# Patient Record
Sex: Female | Born: 1950
Health system: Southern US, Community
[De-identification: ages and names within clinical notes are randomized; demographics above are authoritative.]

## PROBLEM LIST (undated history)

## (undated) DIAGNOSIS — J449 Chronic obstructive pulmonary disease, unspecified: Secondary | ICD-10-CM

## (undated) DIAGNOSIS — I10 Essential (primary) hypertension: Secondary | ICD-10-CM

## (undated) DIAGNOSIS — J45909 Unspecified asthma, uncomplicated: Secondary | ICD-10-CM

## (undated) DIAGNOSIS — K219 Gastro-esophageal reflux disease without esophagitis: Secondary | ICD-10-CM

## (undated) DIAGNOSIS — E119 Type 2 diabetes mellitus without complications: Secondary | ICD-10-CM

## (undated) DIAGNOSIS — J40 Bronchitis, not specified as acute or chronic: Secondary | ICD-10-CM

## (undated) DIAGNOSIS — Z87442 Personal history of urinary calculi: Secondary | ICD-10-CM

## (undated) DIAGNOSIS — E7800 Pure hypercholesterolemia, unspecified: Secondary | ICD-10-CM

## (undated) HISTORY — PX: ABDOMINAL HYSTERECTOMY: SHX81

## (undated) HISTORY — DX: Chronic obstructive pulmonary disease, unspecified: J44.9

## (undated) HISTORY — PX: TUBAL LIGATION: SHX77

## (undated) HISTORY — DX: Type 2 diabetes mellitus without complications: E11.9

---

## 2007-07-25 ENCOUNTER — Other Ambulatory Visit: Payer: Self-pay

## 2007-07-25 ENCOUNTER — Emergency Department: Payer: Self-pay | Admitting: Emergency Medicine

## 2007-07-29 ENCOUNTER — Ambulatory Visit: Payer: Self-pay | Admitting: Emergency Medicine

## 2007-11-14 ENCOUNTER — Ambulatory Visit: Payer: Self-pay | Admitting: Cardiology

## 2009-08-05 ENCOUNTER — Emergency Department (HOSPITAL_COMMUNITY): Admission: EM | Admit: 2009-08-05 | Discharge: 2009-08-05 | Payer: Self-pay | Admitting: Emergency Medicine

## 2009-08-21 ENCOUNTER — Emergency Department (HOSPITAL_COMMUNITY): Admission: EM | Admit: 2009-08-21 | Discharge: 2009-08-21 | Payer: Self-pay | Admitting: Emergency Medicine

## 2009-09-02 ENCOUNTER — Emergency Department (HOSPITAL_COMMUNITY): Admission: EM | Admit: 2009-09-02 | Discharge: 2009-09-02 | Payer: Self-pay | Admitting: Emergency Medicine

## 2009-10-17 ENCOUNTER — Emergency Department (HOSPITAL_COMMUNITY): Admission: EM | Admit: 2009-10-17 | Discharge: 2009-10-17 | Payer: Self-pay | Admitting: Emergency Medicine

## 2009-10-24 ENCOUNTER — Emergency Department (HOSPITAL_COMMUNITY): Admission: EM | Admit: 2009-10-24 | Discharge: 2009-10-24 | Payer: Self-pay | Admitting: Emergency Medicine

## 2009-11-01 ENCOUNTER — Emergency Department (HOSPITAL_COMMUNITY): Admission: EM | Admit: 2009-11-01 | Discharge: 2009-11-01 | Payer: Self-pay | Admitting: Emergency Medicine

## 2009-11-13 ENCOUNTER — Emergency Department (HOSPITAL_COMMUNITY): Admission: EM | Admit: 2009-11-13 | Discharge: 2009-11-14 | Payer: Self-pay | Admitting: Emergency Medicine

## 2009-11-26 ENCOUNTER — Emergency Department (HOSPITAL_COMMUNITY): Admission: EM | Admit: 2009-11-26 | Discharge: 2009-11-26 | Payer: Self-pay | Admitting: Emergency Medicine

## 2010-01-20 ENCOUNTER — Emergency Department (HOSPITAL_COMMUNITY): Admission: EM | Admit: 2010-01-20 | Discharge: 2010-01-20 | Payer: Self-pay | Admitting: Emergency Medicine

## 2010-01-25 ENCOUNTER — Emergency Department (HOSPITAL_COMMUNITY): Admission: EM | Admit: 2010-01-25 | Discharge: 2010-01-25 | Payer: Self-pay | Admitting: Emergency Medicine

## 2010-01-30 ENCOUNTER — Ambulatory Visit: Payer: Self-pay | Admitting: Otolaryngology

## 2010-10-07 LAB — BASIC METABOLIC PANEL
BUN: 16 mg/dL (ref 6–23)
BUN: 24 mg/dL — ABNORMAL HIGH (ref 6–23)
CO2: 27 mEq/L (ref 19–32)
CO2: 28 mEq/L (ref 19–32)
Calcium: 8.9 mg/dL (ref 8.4–10.5)
Calcium: 9.5 mg/dL (ref 8.4–10.5)
Chloride: 105 mEq/L (ref 96–112)
Creatinine, Ser: 0.76 mg/dL (ref 0.4–1.2)
Creatinine, Ser: 0.89 mg/dL (ref 0.4–1.2)
GFR calc Af Amer: >60 mL/min (ref >60)
GFR calc non Af Amer: >60 mL/min (ref >60)
Glucose, Bld: 108 mg/dL — ABNORMAL HIGH (ref 70–99)
Glucose, Bld: 116 mg/dL — ABNORMAL HIGH (ref 70–99)
Potassium: 2.8 mEq/L — ABNORMAL LOW (ref 3.5–5.1)
Sodium: 139 mEq/L (ref 135–145)
Sodium: 139 mEq/L (ref 135–145)

## 2010-10-07 LAB — CBC
HCT: 37.8 % (ref 36.0–46.0)
Hemoglobin: 13.4 g/dL (ref 12.0–15.0)
MCHC: 35.6 g/dL (ref 30.0–36.0)
MCV: 84.8 fL (ref 78.0–100.0)
Platelets: 229 10*3/uL (ref 150–400)
RBC: 4.45 MIL/uL (ref 3.87–5.11)
RDW: 14.3 % (ref 11.5–15.5)
WBC: 14.7 10*3/uL — ABNORMAL HIGH (ref 4.0–10.5)

## 2010-10-07 LAB — DIFFERENTIAL
Basophils Absolute: 0.1 10*3/uL (ref 0.0–0.1)
Basophils Relative: 1 % (ref 0–1)
Eosinophils Absolute: 0.1 10*3/uL (ref 0.0–0.7)
Eosinophils Relative: 1 % (ref 0–5)
Lymphocytes Relative: 24 % (ref 12–46)
Lymphs Abs: 3.5 10*3/uL (ref 0.7–4.0)
Monocytes Absolute: 0.9 10*3/uL (ref 0.1–1.0)
Monocytes Relative: 6 % (ref 3–12)
Neutro Abs: 10.2 10*3/uL — ABNORMAL HIGH (ref 1.7–7.7)
Neutrophils Relative %: 69 % (ref 43–77)

## 2010-10-07 LAB — BRAIN NATRIURETIC PEPTIDE: Pro B Natriuretic peptide (BNP): <30 pg/mL (ref 0.0–100.0)

## 2011-10-14 IMAGING — CR DG CHEST 1V PORT
1 series · 1 of 1 positions shown · non-contrast
Comparison: August 21, 2009

CLINICAL DATA: Asthma, shortness of breath

PORTABLE CHEST - 1 VIEW

[view not recorded]
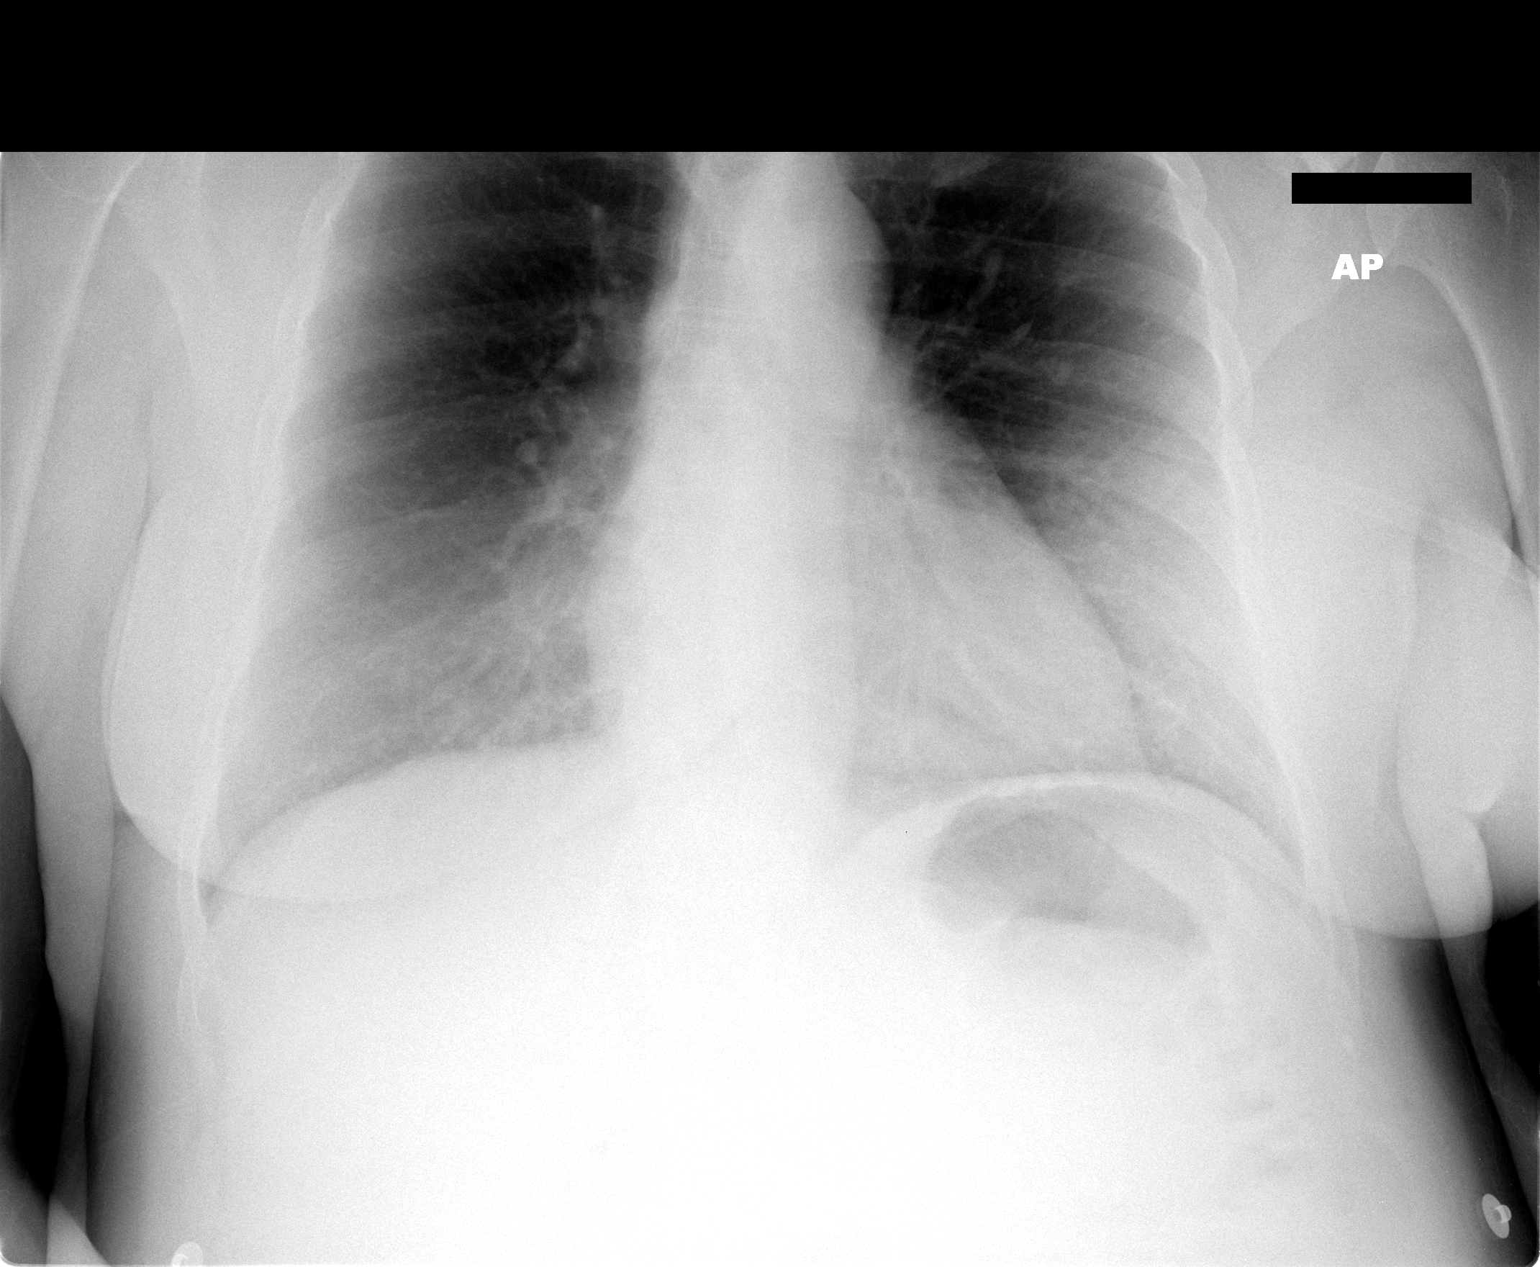

[1 of 1 positions shown; findings below may reference images not displayed]

FINDINGS: The cardiac silhouette, mediastinum, pulmonary
vasculature are within normal limits.  Both lungs are clear.
IMPRESSION: Negative frontal chest x-ray.

## 2012-06-20 ENCOUNTER — Telehealth: Payer: Self-pay

## 2012-06-20 NOTE — Telephone Encounter (Signed)
LMOM to call.

## 2012-07-06 NOTE — Telephone Encounter (Signed)
VM not set up. Will mail another letter. First letter to [address] in [place] was returned.

## 2012-07-22 ENCOUNTER — Telehealth: Payer: Self-pay

## 2012-07-22 NOTE — Telephone Encounter (Signed)
Pt had been referred by Dr. Felecia Shelling for screening colonoscopy. I have received two returned letters back, two separate addresses. One was [address] , and also [address] [place]. I am faxing a note to Dr. Felecia Shelling to see if I can get a correct address or different phone number. I attempted to call again this AM and VM has not been set up.

## 2012-08-15 ENCOUNTER — Other Ambulatory Visit (HOSPITAL_COMMUNITY): Payer: Self-pay | Admitting: Internal Medicine

## 2012-08-15 DIAGNOSIS — M79606 Pain in leg, unspecified: Secondary | ICD-10-CM

## 2012-08-18 ENCOUNTER — Ambulatory Visit (HOSPITAL_COMMUNITY): Payer: Medicaid Other

## 2012-08-19 ENCOUNTER — Ambulatory Visit (HOSPITAL_COMMUNITY): Payer: Medicaid Other | Attending: Internal Medicine

## 2012-09-01 ENCOUNTER — Ambulatory Visit (HOSPITAL_COMMUNITY): Payer: Medicaid Other | Attending: Internal Medicine

## 2014-01-02 ENCOUNTER — Ambulatory Visit: Payer: Medicaid Other | Admitting: Cardiology

## 2016-04-14 ENCOUNTER — Other Ambulatory Visit (HOSPITAL_COMMUNITY): Payer: Self-pay | Admitting: Internal Medicine

## 2016-04-14 DIAGNOSIS — E119 Type 2 diabetes mellitus without complications: Secondary | ICD-10-CM | POA: Diagnosis not present

## 2016-04-14 DIAGNOSIS — I739 Peripheral vascular disease, unspecified: Secondary | ICD-10-CM | POA: Diagnosis not present

## 2016-04-14 DIAGNOSIS — J449 Chronic obstructive pulmonary disease, unspecified: Secondary | ICD-10-CM | POA: Diagnosis not present

## 2016-04-14 DIAGNOSIS — Z23 Encounter for immunization: Secondary | ICD-10-CM | POA: Diagnosis not present

## 2016-04-14 DIAGNOSIS — F17200 Nicotine dependence, unspecified, uncomplicated: Secondary | ICD-10-CM | POA: Diagnosis not present

## 2016-04-14 DIAGNOSIS — I10 Essential (primary) hypertension: Secondary | ICD-10-CM | POA: Diagnosis not present

## 2016-04-14 DIAGNOSIS — F17210 Nicotine dependence, cigarettes, uncomplicated: Secondary | ICD-10-CM | POA: Diagnosis not present

## 2016-04-17 ENCOUNTER — Ambulatory Visit (HOSPITAL_COMMUNITY): Payer: Medicaid Other

## 2016-04-22 ENCOUNTER — Ambulatory Visit (HOSPITAL_COMMUNITY)
Admission: RE | Admit: 2016-04-22 | Discharge: 2016-04-22 | Disposition: A | Discharge status: Home / Self Care | Payer: Medicare Other | Source: Ambulatory Visit | Attending: Internal Medicine | Admitting: Internal Medicine

## 2016-04-22 DIAGNOSIS — I739 Peripheral vascular disease, unspecified: Secondary | ICD-10-CM | POA: Insufficient documentation

## 2016-04-22 DIAGNOSIS — I70213 Atherosclerosis of native arteries of extremities with intermittent claudication, bilateral legs: Secondary | ICD-10-CM | POA: Diagnosis not present

## 2016-05-01 ENCOUNTER — Other Ambulatory Visit: Payer: Self-pay | Admitting: Vascular Surgery

## 2016-05-01 DIAGNOSIS — I739 Peripheral vascular disease, unspecified: Secondary | ICD-10-CM

## 2016-05-29 ENCOUNTER — Encounter: Payer: Self-pay | Admitting: Vascular Surgery

## 2016-06-04 ENCOUNTER — Ambulatory Visit (INDEPENDENT_AMBULATORY_CARE_PROVIDER_SITE_OTHER): Payer: Medicare Other | Admitting: Vascular Surgery

## 2016-06-04 ENCOUNTER — Ambulatory Visit (HOSPITAL_COMMUNITY)
Admission: RE | Admit: 2016-06-04 | Discharge: 2016-06-04 | Disposition: A | Discharge status: Home / Self Care | Payer: Medicare Other | Source: Ambulatory Visit | Attending: Vascular Surgery | Admitting: Vascular Surgery

## 2016-06-04 ENCOUNTER — Encounter: Payer: Self-pay | Admitting: Vascular Surgery

## 2016-06-04 VITALS — BP 136/78 | HR 85 | Temp 98.2°F | Resp 18 | Ht 68.0 in | Wt 160.8 lb

## 2016-06-04 DIAGNOSIS — F17210 Nicotine dependence, cigarettes, uncomplicated: Secondary | ICD-10-CM | POA: Diagnosis not present

## 2016-06-04 DIAGNOSIS — R0989 Other specified symptoms and signs involving the circulatory and respiratory systems: Secondary | ICD-10-CM | POA: Diagnosis not present

## 2016-06-04 DIAGNOSIS — I739 Peripheral vascular disease, unspecified: Secondary | ICD-10-CM

## 2016-06-04 NOTE — Progress Notes (Addendum)
Referring Physician: Dr Felecia ShellingFanta  Patient name: Andrea Horton MRN: [id number] DOB: May 13, 1951 Sex: female  REASON FOR CONSULT: Right leg claudication  HPI: Andrea Horton is a 65 y.o. female,  with a six-month history of pain in her right calf after walking about 2 blocks. The pain dissipates with rest. She denies rest pain in the right foot. She has no history of nonhealing wounds in the right foot. She currently smokes about 1 pack of cigarettes per day. She states she is interested in quitting. She also has a history of diabetes. She is on aspirin and statin. Other medical problems include COPD and she is on several inhalers and nebulizer for this.  Past Medical History:  Diagnosis Date  . COPD (chronic obstructive pulmonary disease) (HCC)   . Diabetes mellitus without complication (HCC)    History reviewed. No pertinent surgical history.  Family History  Problem Relation Age of Onset  . Heart disease Mother     before age 65  . Heart disease Sister     before age 65  . Heart disease Brother     before age 65    SOCIAL HISTORY: Social History   Social History  . Marital status: Single    Spouse name: N/A  . Number of children: N/A  . Years of education: N/A   Occupational History  . Not on file.   Social History Main Topics  . Smoking status: Current Every Day Smoker    Packs/day: 1.00    Years: 35.00    Types: Cigarettes  . Smokeless tobacco: Never Used  . Alcohol use No  . Drug use: No  . Sexual activity: Not on file   Other Topics Concern  . Not on file   Social History Narrative  . No narrative on file    No Known Allergies  Current Outpatient Prescriptions  Medication Sig Dispense Refill  . albuterol (PROVENTIL HFA;VENTOLIN HFA) 108 (90 Base) MCG/ACT inhaler Inhale into the lungs every 6 (six) hours as needed for wheezing or shortness of breath.    Marland Kitchen. amLODipine (NORVASC) 10 MG tablet Take 10 mg by mouth daily.    Marland Kitchen. aspirin 81 MG tablet Take 81 mg  by mouth daily.    . Fluticasone-Salmeterol (ADVAIR DISKUS) 100-50 MCG/DOSE AEPB Inhale 1 puff into the lungs 2 (two) times daily.    . metFORMIN (GLUCOPHAGE) 500 MG tablet Take 500 mg by mouth 2 (two) times daily.  3  . simvastatin (ZOCOR) 20 MG tablet Take 20 mg by mouth daily.  3   No current facility-administered medications for this visit.     ROS:   General:  No weight loss, Fever, chills  HEENT: No recent headaches, no nasal bleeding, no visual changes, no sore throat  Neurologic: No dizziness, blackouts, seizures. No recent symptoms of stroke or mini- stroke. No recent episodes of slurred speech, or temporary blindness.  Cardiac: No recent episodes of chest pain/pressure, no shortness of breath at rest.  + shortness of breath with exertion.  Denies history of atrial fibrillation or irregular heartbeat  Vascular: No history of rest pain in feet.  + history of claudication.  No history of non-healing ulcer, No history of DVT   Pulmonary: No home oxygen, + productive cough, no hemoptysis,  No asthma or wheezing  Musculoskeletal:  [ ]  Arthritis, [ ]  Low back pain,  [ ]  Joint pain  Hematologic:No history of hypercoagulable state.  No history of easy bleeding.  No  history of anemia  Gastrointestinal: No hematochezia or melena,  No gastroesophageal reflux, no trouble swallowing  Urinary: [ ]  chronic Kidney disease, [ ]  on HD - [ ]  MWF or [ ]  TTHS, [ ]  Burning with urination, [ ]  Frequent urination, [ ]  Difficulty urinating;   Skin: No rashes  Psychological: No history of anxiety,  No history of depression   Physical Examination  Vitals:   06/04/16 0857  BP: 136/78  Pulse: 85  Resp: 18  Temp: 98.2 F (36.8 C)  TempSrc: Oral  SpO2: 98%  Weight: 160 lb 12.8 oz (72.9 kg)  Height: 5\' 8"  (1.727 m)    Body mass index is 24.45 kg/m.  General:  Alert and oriented, no acute distress HEENT: Normal Neck: bilateral carotid bruits left > right  Pulmonary: Clear to  auscultation bilaterally Cardiac: Regular Rate and Rhythm without murmur Abdomen: Soft, non-tender, non-distended, no mass, no scars Skin: No rash Extremity Pulses:  2+ radial, brachial, femoral, dorsalis pedis, posterior tibial pulses bilaterally Musculoskeletal: No deformity or edema  Neurologic: Upper and lower extremity motor 5/5 and symmetric  DATA:  Patient had bilateral ABIs performed previously which were 0.6 on the right 1.03 on the left. His were done at [facility] 04/22/2016. She had an arterial duplex exam at our office today which showed greater than 75% stenosis of the mid and distal right superficial femoral artery  ASSESSMENT:  Patient with right leg claudication. She is currently not at risk of limb loss with an ABI of 0.6. I discussed with the patient today smoking cessation for greater than 3 minutes. I also discussed with her the possibility of a walking program with conservative management initially of 30 minutes daily. Most patients are able to improve her walking distance somewhat with this. I also discussed with her the possibility of an arteriogram and possible intervention for her right superficial femoral artery stenosis. She currently prefers conservative management with a walking program. She will follow-up with us in 6 months time for repeat ABIs. If she decides she wishes intervention she will follow-up sooner.  She was noted to have bilateral carotid bruits today on exam.  Will get duplex in the near future to evaluate for carotid stenosis    PLAN:  See above   Fabienne Brunsharles Fields, MD [facility] [place] Office: [phone] Pager: [phone]

## 2016-06-04 NOTE — Addendum Note (Signed)
Addended by: Sharee PimpleMCCHESNEY, MARILYN K on: 06/04/2016 03:22 PM   Modules accepted: Orders

## 2016-06-09 ENCOUNTER — Ambulatory Visit: Payer: Medicare Other | Admitting: Family

## 2016-06-09 ENCOUNTER — Encounter (HOSPITAL_COMMUNITY): Payer: Self-pay

## 2016-06-09 ENCOUNTER — Encounter (HOSPITAL_COMMUNITY): Payer: Medicare Other

## 2016-06-17 ENCOUNTER — Encounter: Payer: Self-pay | Admitting: Family

## 2016-06-23 ENCOUNTER — Encounter: Payer: Self-pay | Admitting: Family

## 2016-06-23 ENCOUNTER — Ambulatory Visit (INDEPENDENT_AMBULATORY_CARE_PROVIDER_SITE_OTHER): Payer: Medicare Other | Admitting: Family

## 2016-06-23 ENCOUNTER — Ambulatory Visit (HOSPITAL_COMMUNITY)
Admission: RE | Admit: 2016-06-23 | Discharge: 2016-06-23 | Disposition: A | Discharge status: Home / Self Care | Payer: Medicare Other | Source: Ambulatory Visit | Attending: Vascular Surgery | Admitting: Vascular Surgery

## 2016-06-23 VITALS — BP 130/75 | HR 69 | Temp 97.4°F | Ht 68.0 in | Wt 157.8 lb

## 2016-06-23 DIAGNOSIS — R0989 Other specified symptoms and signs involving the circulatory and respiratory systems: Secondary | ICD-10-CM

## 2016-06-23 DIAGNOSIS — I6522 Occlusion and stenosis of left carotid artery: Secondary | ICD-10-CM

## 2016-06-23 DIAGNOSIS — I70211 Atherosclerosis of native arteries of extremities with intermittent claudication, right leg: Secondary | ICD-10-CM

## 2016-06-23 LAB — VAS US CAROTID
LCCADDIAS: -15 cm/s
LEFT ECA DIAS: -130 cm/s
Left CCA dist sys: -42 cm/s
Left CCA prox dias: 13 cm/s
Left CCA prox sys: 67 cm/s
Left ICA dist dias: -26 cm/s
Left ICA dist sys: -46 cm/s
Left ICA prox dias: 250 cm/s
Left ICA prox sys: 604 cm/s
RCCADSYS: -77 cm/s
RCCAPSYS: 152 cm/s
RIGHT CCA MID DIAS: 13 cm/s
RIGHT ECA DIAS: -30 cm/s
Right CCA prox dias: 18 cm/s

## 2016-06-23 NOTE — Progress Notes (Signed)
[facility] [place] HISTORY AND PHYSICAL   MRN : [id number]  History of Present Illness:   Andrea Horton is a 65 y.o. female patient of Dr. Darrick Penna with a history of pain in her right calf after walking about 2 blocks since about May of 2017. The pain dissipates with rest. She denies rest pain in the right foot. She has no history of nonhealing wounds in the right foot. She currently smokes about 1 pack of cigarettes per day. She states she is interested in quitting. She also has a history of diabetes. She is on aspirin and statin. Other medical problems include COPD and she is on several inhalers and nebulizer for this.  She was evaluated by Dr. Darrick Penna on 06-04-16 and found to have bilateral carotid bruits; she returns today for carotid duplex.  The patient denies any history of TIA or stroke symptoms, specifically the patient denies a history of amaurosis fugax or monocular blindness, denies a history unilateral  of facial drooping, denies a history of hemiplegia, and denies a history of receptive or expressive aphasia.    She reports intermittent right hand numbness since about May of June of 2017 which resolves by shaking her hand, denies weakness  Patient had bilateral ABIs performed previously which were 0.6 on the right 1.03 on the left. This was done at [facility] 04/22/2016. She had an arterial duplex exam at our office on 06-04-16 which showed greater than 75% stenosis of the mid and distal right superficial femoral artery  At her 06-04-16 visit Dr. Darrick Penna indicated that pt is currently not at risk of limb loss with an ABI of 0.6. He discussed with the patient smoking cessation. He also discussed with her the possibility of a walking program with conservative management initially of 30 minutes daily. Most patients are able to improve their walking distance somewhat with this. He also discussed with her the possibility of an arteriogram and possible  intervention for her right superficial femoral artery stenosis. At that time she prefered conservative management with a walking program. She was to follow-up with [place] in 6 months time for repeat ABIs. If she decides she wishes intervention she will follow-up sooner.  Pt Diabetic: Yes, states in control Pt smoker: smoker  (1 ppd x 35 yrs)  Pt meds include: Statin :Yes Betablocker: No ASA: Yes Other anticoagulants/antiplatelets: no   Current Outpatient Prescriptions  Medication Sig Dispense Refill  . albuterol (PROVENTIL HFA;VENTOLIN HFA) 108 (90 Base) MCG/ACT inhaler Inhale into the lungs every 6 (six) hours as needed for wheezing or shortness of breath.    Marland Kitchen amLODipine (NORVASC) 10 MG tablet Take 10 mg by mouth daily.    Marland Kitchen aspirin 81 MG tablet Take 81 mg by mouth daily.    . Fluticasone-Salmeterol (ADVAIR DISKUS) 100-50 MCG/DOSE AEPB Inhale 1 puff into the lungs 2 (two) times daily.    . metFORMIN (GLUCOPHAGE) 500 MG tablet Take 500 mg by mouth 2 (two) times daily.  3  . simvastatin (ZOCOR) 20 MG tablet Take 20 mg by mouth daily.  3   No current facility-administered medications for this visit.     Past Medical History:  Diagnosis Date  . COPD (chronic obstructive pulmonary disease) (HCC)   . Diabetes mellitus without complication [facility])     Social History Social History  Substance Use Topics  . Smoking status: Current Every Day Smoker    Packs/day: 1.00    Years: 35.00    Types: Cigarettes  .  Smokeless tobacco: Never Used  . Alcohol use No    Family History Family History  Problem Relation Age of Onset  . Heart disease Mother     before age 65  . Heart disease Sister     before age 360  . Heart disease Brother     before age 65    Surgical History No past surgical history on file.  No Known Allergies  Current Outpatient Prescriptions  Medication Sig Dispense Refill  . albuterol (PROVENTIL HFA;VENTOLIN HFA) 108 (90 Base) MCG/ACT inhaler Inhale into the lungs  every 6 (six) hours as needed for wheezing or shortness of breath.    Marland Kitchen. amLODipine (NORVASC) 10 MG tablet Take 10 mg by mouth daily.    Marland Kitchen. aspirin 81 MG tablet Take 81 mg by mouth daily.    . Fluticasone-Salmeterol (ADVAIR DISKUS) 100-50 MCG/DOSE AEPB Inhale 1 puff into the lungs 2 (two) times daily.    . metFORMIN (GLUCOPHAGE) 500 MG tablet Take 500 mg by mouth 2 (two) times daily.  3  . simvastatin (ZOCOR) 20 MG tablet Take 20 mg by mouth daily.  3   No current facility-administered medications for this visit.      REVIEW OF SYSTEMS: See HPI for pertinent positives and negatives.  Physical Examination Vitals:   06/23/16 0914  BP: 130/75  Pulse: 69  Temp: 97.4 F (36.3 C)  TempSrc: Oral  SpO2: 100%  Weight: 157 lb 12.8 oz (71.6 kg)  Height: 5\' 8"  (1.727 m)   Body mass index is 23.99 kg/m.  General:  WDWN in NAD Gait: Normal HENT: WNL Eyes: Pupils equal Pulmonary: normal non-labored breathing, limited air movement in all fields, no rales, rhonchi, or wheezing. + moist cough.  Cardiac: RRR, no murmur detected  Abdomen: soft, NT, no masses palpated Skin: no rashes, no ulcers, no cellulitis.   VASCULAR EXAM  Carotid Bruits Right Left   Positive Positive        Bilateral radial pulses are 2+ palpable and =        Abdominal aortic pulse is not palpable.               VASCULAR EXAM: Extremities without ischemic changes, without Gangrene; without open wounds.                                                                                                          LE Pulses Right Left       FEMORAL  2+ palpable  2+ palpable        POPLITEAL  not palpable   not palpable       POSTERIOR TIBIAL  not palpable    faintly palpable        DORSALIS PEDIS      ANTERIOR TIBIAL not palpable  not palpable      Musculoskeletal: no muscle wasting or atrophy; no peripheral edema  Neurologic: A&O X 3; Appropriate Affect ;  SENSATION: normal; MOTOR FUNCTION: 5/5 Symmetric,  CN 2-12 intact Speech is fluent/normal    ASSESSMENT:  Andrea Horton  Andrea Horton is a 65 y.o. female who has had no stroke or TIA symptoms. She has had right hand intermittent tingling since about May or June of this year that has not worsened, alleviated by shaking her right hand. She denies any sx's of hemiplegia, speech or vision changes.   DATA Today's carotid duplex indicates no significant stenosis of the bilateral CCA's. >50% stenosis of the bilateral ECA's. <40% stenosis of the right proximal ICA. 80-99% stenosis of the left proximal ICA.  Bilateral vertebral artery flow is antegrade. Bilateral subclavian artery waveforms are normal. No previous carotid duplex exam at this facility for comparison. Scan quality is adequate. Location of stenosis is at the bifurcation. Normal ICA past stenosis. Anatomy is WNL. Location of bifurcation is mid (hyoid notch).   PLAN:   Based on today's exam and non-invasive vascular lab results, the patient will follow up in at Dr. Darrick PennaFields soonest available appointment to discuss possible left CEA.  The patient was counseled re smoking cessation and given several free resources re smoking cessation.    I discussed in depth with the patient the nature of atherosclerosis, and emphasized the importance of maximal medical management including strict control of blood pressure, blood glucose, and lipid levels, obtaining regular exercise, and cessation of smoking.  The patient is aware that without maximal medical management the underlying atherosclerotic disease process will progress, limiting the benefit of any interventions.  The patient was given information about stroke prevention and what symptoms should prompt the patient to seek immediate medical care.  The patient was given information about PAD including signs, symptoms, treatment, what symptoms should prompt the patient to seek immediate medical care, and risk reduction measures to take. Thank you for  allowing us to participate in this patient's care.  Charisse MarchSuzanne Kalep Full, RN, MSN, FNP-C [facility] Office: [phone]  Clinic MD: Early 06/23/2016 9:40 AM

## 2016-06-23 NOTE — Patient Instructions (Signed)
Stroke Prevention Some medical conditions and behaviors are associated with an increased chance of having a stroke. You may prevent a stroke by making healthy choices and managing medical conditions. How can I reduce my risk of having a stroke?  Stay physically active. Get at least 30 minutes of activity on most or all days.  Do not smoke. It may also be helpful to avoid exposure to secondhand smoke.  Limit alcohol use. Moderate alcohol use is considered to be:  No more than 2 drinks per day for men.  No more than 1 drink per day for nonpregnant women.  Eat healthy foods. This involves:  Eating 5 or more servings of fruits and vegetables a day.  Making dietary changes that address high blood pressure (hypertension), high cholesterol, diabetes, or obesity.  Manage your cholesterol levels.  Making food choices that are high in fiber and low in saturated fat, trans fat, and cholesterol may control cholesterol levels.  Take any prescribed medicines to control cholesterol as directed by your health care provider.  Manage your diabetes.  Controlling your carbohydrate and sugar intake is recommended to manage diabetes.  Take any prescribed medicines to control diabetes as directed by your health care provider.  Control your hypertension.  Making food choices that are low in salt (sodium), saturated fat, trans fat, and cholesterol is recommended to manage hypertension.  Ask your health care provider if you need treatment to lower your blood pressure. Take any prescribed medicines to control hypertension as directed by your health care provider.  If you are 18-39 years of age, have your blood pressure checked every 3-5 years. If you are 40 years of age or older, have your blood pressure checked every year.  Maintain a healthy weight.  Reducing calorie intake and making food choices that are low in sodium, saturated fat, trans fat, and cholesterol are recommended to manage  weight.  Stop drug abuse.  Avoid taking birth control pills.  Talk to your health care provider about the risks of taking birth control pills if you are over 35 years old, smoke, get migraines, or have ever had a blood clot.  Get evaluated for sleep disorders (sleep apnea).  Talk to your health care provider about getting a sleep evaluation if you snore a lot or have excessive sleepiness.  Take medicines only as directed by your health care provider.  For some people, aspirin or blood thinners (anticoagulants) are helpful in reducing the risk of forming abnormal blood clots that can lead to stroke. If you have the irregular heart rhythm of atrial fibrillation, you should be on a blood thinner unless there is a good reason you cannot take them.  Understand all your medicine instructions.  Make sure that other conditions (such as anemia or atherosclerosis) are addressed. Get help right away if:  You have sudden weakness or numbness of the face, arm, or leg, especially on one side of the body.  Your face or eyelid droops to one side.  You have sudden confusion.  You have trouble speaking (aphasia) or understanding.  You have sudden trouble seeing in one or both eyes.  You have sudden trouble walking.  You have dizziness.  You have a loss of balance or coordination.  You have a sudden, severe headache with no known cause.  You have new chest pain or an irregular heartbeat. Any of these symptoms may represent a serious problem that is an emergency. Do not wait to see if the symptoms will go away.   Get medical help at once. Call your local emergency services (911 in [place] Do not drive yourself to the hospital. This information is not intended to replace advice given to you by your health care provider. Make sure you discuss any questions you have with your health care provider. Document Released: 08/13/2004 Document Revised: 12/12/2015 Document Reviewed: 01/06/2013 Elsevier  Interactive Patient Education  2017 [employer]  

## 2016-06-25 DIAGNOSIS — E785 Hyperlipidemia, unspecified: Secondary | ICD-10-CM | POA: Diagnosis not present

## 2016-06-25 DIAGNOSIS — E119 Type 2 diabetes mellitus without complications: Secondary | ICD-10-CM | POA: Diagnosis not present

## 2016-06-25 DIAGNOSIS — R739 Hyperglycemia, unspecified: Secondary | ICD-10-CM | POA: Diagnosis not present

## 2016-06-25 DIAGNOSIS — I739 Peripheral vascular disease, unspecified: Secondary | ICD-10-CM | POA: Diagnosis not present

## 2016-06-25 DIAGNOSIS — F17200 Nicotine dependence, unspecified, uncomplicated: Secondary | ICD-10-CM | POA: Diagnosis not present

## 2016-06-25 DIAGNOSIS — Z0000 Encounter for general adult medical examination without abnormal findings: Secondary | ICD-10-CM | POA: Diagnosis not present

## 2016-06-25 DIAGNOSIS — J449 Chronic obstructive pulmonary disease, unspecified: Secondary | ICD-10-CM | POA: Diagnosis not present

## 2016-06-25 DIAGNOSIS — I158 Other secondary hypertension: Secondary | ICD-10-CM | POA: Diagnosis not present

## 2016-06-25 DIAGNOSIS — I10 Essential (primary) hypertension: Secondary | ICD-10-CM | POA: Diagnosis not present

## 2016-07-27 ENCOUNTER — Encounter: Payer: Self-pay | Admitting: Vascular Surgery

## 2016-07-30 ENCOUNTER — Other Ambulatory Visit: Payer: Self-pay

## 2016-07-30 ENCOUNTER — Ambulatory Visit (INDEPENDENT_AMBULATORY_CARE_PROVIDER_SITE_OTHER): Payer: Medicare Other | Admitting: Vascular Surgery

## 2016-07-30 ENCOUNTER — Encounter: Payer: Self-pay | Admitting: Vascular Surgery

## 2016-07-30 VITALS — BP 152/75 | HR 79 | Temp 98.8°F | Resp 16 | Ht 68.0 in | Wt 158.0 lb

## 2016-07-30 DIAGNOSIS — I6522 Occlusion and stenosis of left carotid artery: Secondary | ICD-10-CM | POA: Diagnosis not present

## 2016-07-30 NOTE — Progress Notes (Signed)
Patient name: Andrea Horton MRN: [id number] DOB: April 14, 1951 Sex: female   HPI: Andrea Horton is a 66 y.o. female who we have been following for peripheral arterial disease. She was noted on recent physical exam to have a left-sided carotid bruit. Subsequent carotid duplex scan showed a high-grade greater than 80% left internal carotid artery stenosis. The patient denies any symptoms of amaurosis. However on detailed questioning, she has had several episodes recently where she had weakness in her right arm dropping things and occasional numbness and tingling in her right arm. She denies prior history of stroke. She is on aspirin daily. She is also on a statin. She was recently put on Pletal for her chronic right leg claudication which has helped her right leg somewhat. She currently does not really complain of claudication symptoms. She currently smokes 1-2 packs of cigarettes per day. Greater than 3 minutes today regarding smoking cessation counseling. Other medical problems include COPD and diabetes but of which are currently stable.  Past Medical History:  Diagnosis Date  . COPD (chronic obstructive pulmonary disease) (HCC)   . Diabetes mellitus without complication (HCC)    No past surgical history on file.  Family History  Problem Relation Age of Onset  . Heart disease Mother     before age 66  . Heart disease Sister     before age 66  . Heart disease Brother     before age 66    SOCIAL HISTORY: Social History   Social History  . Marital status: Single    Spouse name: N/A  . Number of children: N/A  . Years of education: N/A   Occupational History  . Not on file.   Social History Main Topics  . Smoking status: Current Every Day Smoker    Packs/day: 1.00    Years: 35.00    Types: Cigarettes  . Smokeless tobacco: Never Used  . Alcohol use No  . Drug use: No  . Sexual activity: Not on file   Other Topics Concern  . Not on file   Social History Narrative  . No  narrative on file    No Known Allergies  Current Outpatient Prescriptions  Medication Sig Dispense Refill  . albuterol (PROVENTIL HFA;VENTOLIN HFA) 108 (90 Base) MCG/ACT inhaler Inhale into the lungs every 6 (six) hours as needed for wheezing or shortness of breath.    Marland Kitchen. amLODipine (NORVASC) 10 MG tablet Take 10 mg by mouth daily.    Marland Kitchen. aspirin 81 MG tablet Take 81 mg by mouth daily.    . cilostazol (PLETAL) 100 MG tablet Take 100 mg by mouth 2 (two) times daily.    . Fluticasone-Salmeterol (ADVAIR DISKUS) 100-50 MCG/DOSE AEPB Inhale 1 puff into the lungs 2 (two) times daily.    . metFORMIN (GLUCOPHAGE) 500 MG tablet Take 500 mg by mouth 2 (two) times daily.  3  . simvastatin (ZOCOR) 20 MG tablet Take 20 mg by mouth daily.  3   No current facility-administered medications for this visit.     ROS:   General:  No weight loss, Fever, chills  HEENT: No recent headaches, no nasal bleeding, no visual changes, no sore throat  Neurologic: No dizziness, blackouts, seizures. No recent symptoms of stroke or mini- stroke. No recent episodes of slurred speech, or temporary blindness.  Cardiac: No recent episodes of chest pain/pressure, no shortness of breath at rest.  No shortness of breath with exertion.  Denies history of atrial fibrillation or irregular  heartbeat  Vascular: No history of rest pain in feet.  No history of claudication.  No history of non-healing ulcer, No history of DVT   Pulmonary: No home oxygen, no productive cough, no hemoptysis,  No asthma or wheezing  Musculoskeletal:  [ ]  Arthritis, [ ]  Low back pain,  [ ]  Joint pain  Hematologic:No history of hypercoagulable state.  No history of easy bleeding.  No history of anemia  Gastrointestinal: No hematochezia or melena,  No gastroesophageal reflux, no trouble swallowing  Urinary: [ ]  chronic Kidney disease, [ ]  on HD - [ ]  MWF or [ ]  TTHS, [ ]  Burning with urination, [ ]  Frequent urination, [ ]  Difficulty urinating;    Skin: No rashes  Psychological: No history of anxiety,  No history of depression   Physical Examination  Vitals:   07/30/16 1052  BP: (!) 152/75  Pulse: 79  Resp: 16  Temp: 98.8 F (37.1 C)  TempSrc: Oral  SpO2: 99%  Weight: 158 lb (71.7 kg)  Height: 5\' 8"  (1.727 m)    Body mass index is 24.02 kg/m.  General:  Alert and oriented, no acute distress HEENT: Normal Neck: harsh left bruit Pulmonary: Clear to auscultation bilaterally Cardiac: Regular Rate and Rhythm without murmur Abdomen: Soft, non-tender, non-distended, no mass Skin: No rash Extremity Pulses:  2+ radial, brachial, femoral pulses bilaterally Musculoskeletal: No deformity or edema  Neurologic: Upper and lower extremity motor 5/5 and symmetric  DATA:  I reviewed the patient's recent carotid duplex scan which showed velocities in the left side of 604/250 greater than 80%, antegrade vertebral flow, less than 40% stenosis on the right side.  ASSESSMENT:  Most likely symptomatic left internal carotid artery stenosis and sounds like she is having occasional TIAs.   PLAN:  She will continue her aspirin. We will have a stress test scheduled for her in the near future. Plan is for left carotid endarterectomy 08/10/2016. Risks benefits possible complications procedure details including but not limited to bleeding infection cranial nerve injury 10-15%, stroke risk 1% were explained the patient today. She understands and agrees to proceed   Fabienne Bruns, MD [facility] Office: [phone] Pager: [phone]

## 2016-08-03 ENCOUNTER — Ambulatory Visit (INDEPENDENT_AMBULATORY_CARE_PROVIDER_SITE_OTHER): Payer: Medicare Other | Admitting: Cardiovascular Disease

## 2016-08-03 ENCOUNTER — Telehealth: Payer: Self-pay | Admitting: Cardiovascular Disease

## 2016-08-03 ENCOUNTER — Encounter: Payer: Self-pay | Admitting: Cardiovascular Disease

## 2016-08-03 ENCOUNTER — Encounter: Payer: Self-pay | Admitting: *Deleted

## 2016-08-03 VITALS — BP 144/71 | HR 79 | Ht 68.0 in | Wt 157.0 lb

## 2016-08-03 DIAGNOSIS — R079 Chest pain, unspecified: Secondary | ICD-10-CM

## 2016-08-03 DIAGNOSIS — I6522 Occlusion and stenosis of left carotid artery: Secondary | ICD-10-CM

## 2016-08-03 DIAGNOSIS — Z01818 Encounter for other preprocedural examination: Secondary | ICD-10-CM

## 2016-08-03 DIAGNOSIS — I10 Essential (primary) hypertension: Secondary | ICD-10-CM

## 2016-08-03 DIAGNOSIS — I739 Peripheral vascular disease, unspecified: Secondary | ICD-10-CM | POA: Diagnosis not present

## 2016-08-03 NOTE — Telephone Encounter (Signed)
No precert required.  Pt has Medicare and Medicaid. °

## 2016-08-03 NOTE — Telephone Encounter (Signed)
[facility]. Checking percert

## 2016-08-03 NOTE — Progress Notes (Signed)
CARDIOLOGY CONSULT NOTE  Patient ID: Andrea Horton MRN: [id number] DOB/AGE: [birth year] 66 y.o.  Admit date: (Not on file) Primary Physician: Avon Gully, MD Referring Physician: Fabienne Bruns MD  Reason for Consultation: preop eval  HPI: The patient is a 66 year old woman with a history of peripheral arterial disease and high-grade 80% left internal carotid artery stenosis. She also has COPD and diabetes mellitus. Left carotid endarterectomy is tentatively scheduled for 08/10/16. She has a history of TIAs.  ECG performed in the office today which I personally reviewed demonstrates normal sinus rhythm with no ischemic ST segment or T-wave abnormalities, nor any arrhythmias.  She denies exertional chest pain and denies a history of myocardial infarction. She smokes one pack of cigarettes daily.  ABIs in November 2017 were 0.60 on the right and 1 on the left. Right ABI suggestive of proximal superficial femoral artery stenosis 50-74% and trifurcation stenosis 75-99%.  Claudication pain alleviated with Pletal.   No Known Allergies  Current Outpatient Prescriptions  Medication Sig Dispense Refill  . acetaminophen (TYLENOL) 500 MG tablet Take 1,000 mg by mouth every 6 (six) hours as needed (for pain).    Marland Kitchen albuterol (PROVENTIL HFA;VENTOLIN HFA) 108 (90 Base) MCG/ACT inhaler Inhale into the lungs every 6 (six) hours as needed for wheezing or shortness of breath.    Marland Kitchen amLODipine (NORVASC) 10 MG tablet Take 10 mg by mouth daily.    Marland Kitchen aspirin 81 MG tablet Take 81 mg by mouth daily.    . cilostazol (PLETAL) 100 MG tablet Take 100 mg by mouth 2 (two) times daily.    . Fluticasone-Salmeterol (ADVAIR DISKUS) 100-50 MCG/DOSE AEPB Inhale 1 puff into the lungs 2 (two) times daily.    Marland Kitchen ipratropium-albuterol (DUONEB) 0.5-2.5 (3) MG/3ML SOLN Take 3 mLs by nebulization every 6 (six) hours as needed (for shortness of breath/wheezing/congestion).    . metFORMIN (GLUCOPHAGE) 500 MG tablet  Take 500 mg by mouth 2 (two) times daily.  3  . simvastatin (ZOCOR) 20 MG tablet Take 20 mg by mouth daily.  3   No current facility-administered medications for this visit.     Past Medical History:  Diagnosis Date  . COPD (chronic obstructive pulmonary disease) (HCC)   . Diabetes mellitus without complication (HCC)     No past surgical history on file.  Social History   Social History  . Marital status: Single    Spouse name: N/A  . Number of children: N/A  . Years of education: N/A   Occupational History  . Not on file.   Social History Main Topics  . Smoking status: Current Every Day Smoker    Packs/day: 1.00    Years: 35.00    Types: Cigarettes    Start date: 04/04/1981  . Smokeless tobacco: Never Used  . Alcohol use No  . Drug use: No  . Sexual activity: Not on file   Other Topics Concern  . Not on file   Social History Narrative  . No narrative on file     No family history of premature CAD in 1st degree relatives.  Prior to Admission medications   Medication Sig Start Date End Date Taking? Authorizing Provider  acetaminophen (TYLENOL) 500 MG tablet Take 1,000 mg by mouth every 6 (six) hours as needed (for pain).   Yes Historical Provider, MD  albuterol (PROVENTIL HFA;VENTOLIN HFA) 108 (90 Base) MCG/ACT inhaler Inhale into the lungs every 6 (six) hours as needed for wheezing or shortness of  breath.   Yes Historical Provider, MD  amLODipine (NORVASC) 10 MG tablet Take 10 mg by mouth daily.   Yes Historical Provider, MD  aspirin 81 MG tablet Take 81 mg by mouth daily.   Yes Historical Provider, MD  cilostazol (PLETAL) 100 MG tablet Take 100 mg by mouth 2 (two) times daily.   Yes Historical Provider, MD  Fluticasone-Salmeterol (ADVAIR DISKUS) 100-50 MCG/DOSE AEPB Inhale 1 puff into the lungs 2 (two) times daily.   Yes Historical Provider, MD  ipratropium-albuterol (DUONEB) 0.5-2.5 (3) MG/3ML SOLN Take 3 mLs by nebulization every 6 (six) hours as needed (for  shortness of breath/wheezing/congestion).   Yes Historical Provider, MD  metFORMIN (GLUCOPHAGE) 500 MG tablet Take 500 mg by mouth 2 (two) times daily. 04/24/16  Yes Historical Provider, MD  simvastatin (ZOCOR) 20 MG tablet Take 20 mg by mouth daily. 04/24/16  Yes Historical Provider, MD     Review of systems complete and found to be negative unless listed above in HPI     Physical exam Blood pressure (!) 144/71, pulse 79, height 5\' 8"  (1.727 m), weight 157 lb (71.2 kg). General: NAD Neck: No JVD, no thyromegaly or thyroid nodule.  Lungs: Clear to auscultation bilaterally with normal respiratory effort. CV: Nondisplaced PMI. Regular rate and rhythm, normal S1/S2, no S3/S4, no murmur.  No peripheral edema.  + left carotid bruit.    Abdomen: Soft, nontender, no distention.  Skin: Intact without lesions or rashes.  Neurologic: Alert and oriented x 3.  Psych: Normal affect. Extremities: No clubbing or cyanosis.  HEENT: Normal.   ECG: Most recent ECG reviewed.  Labs:   Lab Results  Component Value Date   WBC 14.7 (H) 11/01/2009   HGB 13.4 11/01/2009   HCT 37.8 11/01/2009   MCV 84.8 11/01/2009   PLT 229 11/01/2009   No results for input(s): NA, K, CL, CO2, BUN, CREATININE, CALCIUM, PROT, BILITOT, ALKPHOS, ALT, AST, GLUCOSE in the last 168 hours.  Invalid input(s): LABALBU No results found for: CKTOTAL, CKMB, CKMBINDEX, TROPONINI No results found for: CHOL No results found for: HDL No results found for: LDLCALC No results found for: TRIG No results found for: CHOLHDL No results found for: LDLDIRECT       Studies: No results found.  ASSESSMENT AND PLAN:  1. Preoperative risk stratification: ECG is normal. Given peripheral vascular disease in both the right lower extremity and left internal carotid artery, there is a higher propensity for the presence of ischemic heart disease.  I will proceed with a nuclear myocardial perfusion imaging study to evaluate for ischemic heart  disease (Lexiscan Myoview).  2. Hypertension: Mildly elevated on amlodipine 10 mg . Will monitor.  3. Hyperlipidemia: Continue simvastatin.  4. 80% LICA stenosis: Continue ASA and statin.  5. Right lower extremity vascular disease: Claudication relieved with Pletal. Continue ASA and statin. ABI's reviewed above.  Dispo: fu to be determined.    Signed: Prentice DockerSuresh Koneswaran, M.D., F.A.C.C.  08/03/2016, 2:59 PM

## 2016-08-03 NOTE — Addendum Note (Signed)
Addended by: Burman NievesASHWORTH, Maili Shutters T on: 08/03/2016 03:19 PM   Modules accepted: Orders

## 2016-08-03 NOTE — Patient Instructions (Signed)
Your physician recommends that you schedule a follow-up appointment in: TO BE DETERMINED AFTER TESTING WITH DR. Purvis SheffieldKONESWARAN  Your physician recommends that you continue on your current medications as directed. Please refer to the Current Medication list given to you today.  Your physician has requested that you have a lexiscan myoview. For further information please visit [email or web address]. Please follow instruction sheet, as given.  Thank you for choosing [facility]!!

## 2016-08-04 ENCOUNTER — Telehealth: Payer: Self-pay | Admitting: Cardiovascular Disease

## 2016-08-04 NOTE — Telephone Encounter (Signed)
Mrs. Andrea Horton called the office stating that she is going to have to cancel her stress test on 08/05/16 due to a family emergency. She will call the office back to re-schedule.

## 2016-08-05 ENCOUNTER — Inpatient Hospital Stay (HOSPITAL_COMMUNITY): Admission: RE | Admit: 2016-08-05 | Payer: Medicare Other | Source: Ambulatory Visit

## 2016-08-05 ENCOUNTER — Encounter (HOSPITAL_COMMUNITY): Payer: Medicare Other

## 2016-08-06 NOTE — Pre-Procedure Instructions (Signed)
Andrea Horton  08/06/2016      [facility] - [place], [place] - [address] [place] [address] Phone: [phone] Fax: [phone]  [facility] [address], [place] - [address] [place] [address] Phone: [phone] Fax: [phone]    Your procedure is scheduled on January 22  Report to [facility] Admitting at 0530 A.M.  Call this number if you have problems the morning of surgery:  [phone]   Remember:  Do not eat food or drink liquids after midnight.   Take these medicines the morning of surgery with A SIP OF WATER acetaminophen (TYLENOL),  albuterol (PROVENTIL HFA;VENTOLIN HFA), amLODipine (NORVASC), Fluticasone-Salmeterol (ADVAIR DISKUS),   7 days prior to surgery STOP taking any Aspirin, Aleve, Naproxen, Ibuprofen, Motrin, Advil, Goody's, BC's, all herbal medications, fish oil, and all vitamins  How to Manage Your Diabetes Before and After Surgery  Why is it important to control my blood sugar before and after surgery? . Improving blood sugar levels before and after surgery helps healing and can limit problems. . A way of improving blood sugar control is eating a healthy diet by: o  Eating less sugar and carbohydrates o  Increasing activity/exercise o  Talking with your doctor about reaching your blood sugar goals . High blood sugars (greater than 180 mg/dL) can raise your risk of infections and slow your recovery, so you will need to focus on controlling your diabetes during the weeks before surgery. . Make sure that the doctor who takes care of your diabetes knows about your planned surgery including the date and location.  How do I manage my blood sugar before surgery? . Check your blood sugar at least 4 times a day, starting 2 days before surgery, to make sure that the level is not too high or low. o Check your blood sugar the morning of your surgery when you wake up and every 2 hours  until you get to the Short Stay unit. . If your blood sugar is less than 70 mg/dL, you will need to treat for low blood sugar: o Do not take insulin. o Treat a low blood sugar (less than 70 mg/dL) with  cup of clear juice (cranberry or apple), 4 glucose tablets, OR glucose gel. o Recheck blood sugar in 15 minutes after treatment (to make sure it is greater than 70 mg/dL). If your blood sugar is not greater than 70 mg/dL on recheck, call [phone] for further instructions. . Report your blood sugar to the short stay nurse when you get to Short Stay.  . If you are admitted to the hospital after surgery: o Your blood sugar will be checked by the staff and you will probably be given insulin after surgery (instead of oral diabetes medicines) to make sure you have good blood sugar levels. o The goal for blood sugar control after surgery is 80-180 mg/dL.    WHAT DO I DO ABOUT MY DIABETES MEDICATION?   Marland Kitchen Do not take oral diabetes medicines (pills) the morning of surgery. METFORMIN    Do not wear jewelry, make-up or nail polish.  Do not wear lotions, powders, or perfumes, or deoderant.  Do not shave 48 hours prior to surgery.    Do not bring valuables to the hospital.  [facility] is not responsible for any belongings or valuables.  Contacts, dentures or bridgework may not be worn into surgery.  Leave your suitcase in the car.  After surgery it may be brought to your room.  For patients admitted to the hospital, discharge time will be determined by your treatment team.  Patients discharged the day of surgery will not be allowed to drive home.    Special instructions:   [place]- Preparing For Surgery  Before surgery, you can play an important role. Because skin is not sterile, your skin needs to be as free of germs as possible. You can reduce the number of germs on your skin by washing with CHG (chlorahexidine gluconate) Soap before surgery.  CHG is an antiseptic cleaner which kills  germs and bonds with the skin to continue killing germs even after washing.  Please do not use if you have an allergy to CHG or antibacterial soaps. If your skin becomes reddened/irritated stop using the CHG.  Do not shave (including legs and underarms) for at least 48 hours prior to first CHG shower. It is OK to shave your face.  Please follow these instructions carefully.   1. Shower the NIGHT BEFORE SURGERY and the MORNING OF SURGERY with CHG.   2. If you chose to wash your hair, wash your hair first as usual with your normal shampoo.  3. After you shampoo, rinse your hair and body thoroughly to remove the shampoo.  4. Use CHG as you would any other liquid soap. You can apply CHG directly to the skin and wash gently with a scrungie or a clean washcloth.   5. Apply the CHG Soap to your body ONLY FROM THE NECK DOWN.  Do not use on open wounds or open sores. Avoid contact with your eyes, ears, mouth and genitals (private parts). Wash genitals (private parts) with your normal soap.  6. Wash thoroughly, paying special attention to the area where your surgery will be performed.  7. Thoroughly rinse your body with warm water from the neck down.  8. DO NOT shower/wash with your normal soap after using and rinsing off the CHG Soap.  9. Pat yourself dry with a CLEAN TOWEL.   10. Wear CLEAN PAJAMAS   11. Place CLEAN SHEETS on your bed the night of your first shower and DO NOT SLEEP WITH PETS.    Day of Surgery: Do not apply any deodorants/lotions. Please wear clean clothes to the hospital/surgery center.      Please read over the following fact sheets that you were given.

## 2016-08-07 ENCOUNTER — Encounter (HOSPITAL_COMMUNITY)
Admission: RE | Admit: 2016-08-07 | Discharge: 2016-08-07 | Disposition: A | Discharge status: Home / Self Care | Payer: Medicare Other | Source: Ambulatory Visit | Attending: Vascular Surgery | Admitting: Vascular Surgery

## 2016-08-07 ENCOUNTER — Encounter (HOSPITAL_COMMUNITY): Payer: Self-pay | Admitting: Anesthesiology

## 2016-08-07 ENCOUNTER — Encounter (HOSPITAL_COMMUNITY): Payer: Self-pay | Admitting: Vascular Surgery

## 2016-08-07 ENCOUNTER — Encounter (HOSPITAL_COMMUNITY): Payer: Self-pay

## 2016-08-07 DIAGNOSIS — K219 Gastro-esophageal reflux disease without esophagitis: Secondary | ICD-10-CM | POA: Diagnosis not present

## 2016-08-07 DIAGNOSIS — E119 Type 2 diabetes mellitus without complications: Secondary | ICD-10-CM | POA: Diagnosis not present

## 2016-08-07 DIAGNOSIS — I6523 Occlusion and stenosis of bilateral carotid arteries: Secondary | ICD-10-CM | POA: Insufficient documentation

## 2016-08-07 DIAGNOSIS — Z01812 Encounter for preprocedural laboratory examination: Secondary | ICD-10-CM | POA: Insufficient documentation

## 2016-08-07 DIAGNOSIS — I10 Essential (primary) hypertension: Secondary | ICD-10-CM | POA: Insufficient documentation

## 2016-08-07 HISTORY — DX: Bronchitis, not specified as acute or chronic: J40

## 2016-08-07 HISTORY — DX: Gastro-esophageal reflux disease without esophagitis: K21.9

## 2016-08-07 HISTORY — DX: Essential (primary) hypertension: I10

## 2016-08-07 HISTORY — DX: Unspecified asthma, uncomplicated: J45.909

## 2016-08-07 HISTORY — DX: Personal history of urinary calculi: Z87.442

## 2016-08-07 LAB — COMPREHENSIVE METABOLIC PANEL
ALBUMIN: 4.1 g/dL (ref 3.5–5.0)
ALT: 11 U/L — ABNORMAL LOW (ref 14–54)
AST: 12 U/L — AB (ref 15–41)
Alkaline Phosphatase: 74 U/L (ref 38–126)
Anion gap: 8 (ref 5–15)
BUN: 8 mg/dL (ref 6–20)
CHLORIDE: 107 mmol/L (ref 101–111)
CO2: 26 mmol/L (ref 22–32)
Calcium: 9.4 mg/dL (ref 8.9–10.3)
Creatinine, Ser: 0.69 mg/dL (ref 0.44–1.00)
GFR calc Af Amer: >60 mL/min (ref >60)
GLUCOSE: 116 mg/dL — AB (ref 65–99)
POTASSIUM: 3.1 mmol/L — AB (ref 3.5–5.1)
SODIUM: 141 mmol/L (ref 135–145)
Total Bilirubin: 0.6 mg/dL (ref 0.3–1.2)
Total Protein: 7.2 g/dL (ref 6.5–8.1)

## 2016-08-07 LAB — CBC
HCT: 39.3 % (ref 36.0–46.0)
Hemoglobin: 13 g/dL (ref 12.0–15.0)
MCH: 28.3 pg (ref 26.0–34.0)
MCHC: 33.1 g/dL (ref 30.0–36.0)
MCV: 85.6 fL (ref 78.0–100.0)
PLATELETS: 272 10*3/uL (ref 150–400)
RBC: 4.59 MIL/uL (ref 3.87–5.11)
RDW: 14.1 % (ref 11.5–15.5)
WBC: 13.3 10*3/uL — AB (ref 4.0–10.5)

## 2016-08-07 LAB — URINALYSIS, ROUTINE W REFLEX MICROSCOPIC
Bilirubin Urine: NEGATIVE
Glucose, UA: NEGATIVE mg/dL
HGB URINE DIPSTICK: NEGATIVE
Ketones, ur: NEGATIVE mg/dL
Leukocytes, UA: NEGATIVE
NITRITE: NEGATIVE
Protein, ur: NEGATIVE mg/dL
Specific Gravity, Urine: 1.012 (ref 1.005–1.030)
pH: 7 (ref 5.0–8.0)

## 2016-08-07 LAB — SURGICAL PCR SCREEN
MRSA, PCR: POSITIVE — AB
STAPHYLOCOCCUS AUREUS: POSITIVE — AB

## 2016-08-07 LAB — ABO/RH: ABO/RH(D): AB POS

## 2016-08-07 LAB — TYPE AND SCREEN
ABO/RH(D): AB POS
ANTIBODY SCREEN: NEGATIVE

## 2016-08-07 LAB — APTT: aPTT: 38 seconds — ABNORMAL HIGH (ref 24–36)

## 2016-08-07 LAB — PROTIME-INR
INR: 1.08
PROTHROMBIN TIME: 14 s (ref 11.4–15.2)

## 2016-08-07 LAB — GLUCOSE, CAPILLARY: Glucose-Capillary: 128 mg/dL — ABNORMAL HIGH (ref 65–99)

## 2016-08-07 NOTE — Progress Notes (Signed)
Left messgae for [place]phanie with [facility] about patient not able to complete stress test due to weather

## 2016-08-07 NOTE — Progress Notes (Signed)
PCP - [place] Cardiologist - Fields  Chest x-ray - not needed EKG - 08/03/16 Stress Test - was scheduled to have a stress test but the weather prevented that from happening. Needs to reschedule ECHO - denies Cardiac Cath - denies Sleep Study - denies   Fasting Blood Sugar - doesn't check regularly Checks Blood Sugar __not regularly___ times a day  Spoke with anesthesia about stress test     Patient denies shortness of breath, fever, cough and chest pain at PAT appointment   Patient verbalized understanding of instructions that was given to them at the PAT appointment. Patient expressed that there were no further questions.  Patient was also instructed that they will need to review over the PAT instructions again at home before the surgery.

## 2016-08-07 NOTE — Progress Notes (Signed)
Pres[facility] in [place] [phone], left message with patients permission about instructions and where to pick up prescription

## 2016-08-10 ENCOUNTER — Inpatient Hospital Stay (HOSPITAL_COMMUNITY): Admission: RE | Admit: 2016-08-10 | Payer: Medicare Other | Source: Ambulatory Visit

## 2016-08-10 ENCOUNTER — Encounter (HOSPITAL_COMMUNITY): Payer: Medicare Other

## 2016-08-10 NOTE — Progress Notes (Signed)
Anesthesia Chart Review:  Pt is a 66 year old female scheduled for L CEA on 08/17/2016 with Fabienne Brunsharles Fields, MD.   - PCP is Avon Gullyesfaye Fanta, MD  PMH includes:  HTN, DM, asthma, COPD, GERD. Current smoker. BMI 23  Medications include: albuterol, amlodipine, ASA, pletal, advair, duoneb, metformin, simvastatin  Preoperative labs reviewed.  Glucose 116  EKG 08/03/16: Sinus  Rhythm. RSR(V1) nondiagnostic.   Carotid duplex 06/23/16:  1. R ICA stenosis 1-39% 2. L ICA stenosis 80-99%  Pt saw Prentice DockerSuresh Koneswaran, MD with cardiology for pre-op eval 08/03/16, stress test ordered and is scheduled for 08/14/16. Will revisit chart when results available.   Rica Mastngela Therisa Mennella, FNP-BC [facility]/Anesthesiology Phone: [phone] 08/10/2016 3:57 PM

## 2016-08-14 ENCOUNTER — Encounter (HOSPITAL_COMMUNITY)
Admission: RE | Admit: 2016-08-14 | Discharge: 2016-08-14 | Disposition: A | Discharge status: Home / Self Care | Payer: Medicare Other | Source: Ambulatory Visit | Attending: Cardiovascular Disease | Admitting: Cardiovascular Disease

## 2016-08-14 ENCOUNTER — Telehealth: Payer: Self-pay | Admitting: *Deleted

## 2016-08-14 ENCOUNTER — Encounter (HOSPITAL_COMMUNITY): Payer: Medicare Other

## 2016-08-14 ENCOUNTER — Encounter (HOSPITAL_COMMUNITY): Payer: Self-pay

## 2016-08-14 DIAGNOSIS — R079 Chest pain, unspecified: Secondary | ICD-10-CM | POA: Diagnosis not present

## 2016-08-14 DIAGNOSIS — Z01818 Encounter for other preprocedural examination: Secondary | ICD-10-CM | POA: Diagnosis not present

## 2016-08-14 LAB — NM MYOCAR MULTI W/SPECT W/WALL MOTION / EF
CHL CUP NUCLEAR SDS: 0
CSEPPHR: 93 {beats}/min
LV dias vol: 68 mL (ref 46–106)
LVSYSVOL: 26 mL
RATE: 0.27
Rest HR: 90 {beats}/min
SRS: 0
SSS: 0
TID: 1.33

## 2016-08-14 MED ORDER — TECHNETIUM TC 99M TETROFOSMIN IV KIT
10.0000 | PACK | Freq: Once | INTRAVENOUS | Status: AC | PRN
  Administered 2016-08-14: 10.6 via INTRAVENOUS

## 2016-08-14 MED ORDER — REGADENOSON 0.4 MG/5ML IV SOLN
INTRAVENOUS | Status: AC
  Administered 2016-08-14: 0.4 mg via INTRAVENOUS
  Filled 2016-08-14: qty 5

## 2016-08-14 MED ORDER — TECHNETIUM TC 99M TETROFOSMIN IV KIT
30.0000 | PACK | Freq: Once | INTRAVENOUS | Status: AC | PRN
  Administered 2016-08-14: 30 via INTRAVENOUS

## 2016-08-14 MED ORDER — SODIUM CHLORIDE 0.9% FLUSH
INTRAVENOUS | Status: AC
  Administered 2016-08-14: 10 mL via INTRAVENOUS
  Filled 2016-08-14: qty 10

## 2016-08-14 NOTE — Telephone Encounter (Signed)
-----   Message from Laqueta LindenSuresh A Koneswaran, MD sent at 08/14/2016  4:14 PM EST ----- Normal. Can proceed with surgery.

## 2016-08-14 NOTE — Telephone Encounter (Signed)
Awaiting final results. Nothing in Epic yet.

## 2016-08-14 NOTE — Telephone Encounter (Signed)
Stephanie from [facility] requesting cardiac clearance from Dr. Purvis SheffieldKoneswaran for upcoming surgery Monday Dr Darrick PennaFields carotid endarterectomy - pt had stress test done this morning - will route to provider

## 2016-08-14 NOTE — Telephone Encounter (Signed)
Pt made aware

## 2016-08-17 ENCOUNTER — Inpatient Hospital Stay (HOSPITAL_COMMUNITY): Admission: RE | Admit: 2016-08-17 | Payer: Medicaid Other | Source: Ambulatory Visit | Admitting: Vascular Surgery

## 2016-08-17 ENCOUNTER — Encounter (HOSPITAL_COMMUNITY): Admission: RE | Payer: Self-pay | Source: Ambulatory Visit

## 2016-08-17 ENCOUNTER — Other Ambulatory Visit: Payer: Self-pay

## 2016-08-17 SURGERY — ENDARTERECTOMY, CAROTID
Anesthesia: General | Laterality: Left

## 2016-08-17 NOTE — Anesthesia Preprocedure Evaluation (Deleted)
Anesthesia Evaluation   Patient awake    Reviewed: Allergy & Precautions, NPO status , Patient's Chart, lab work & pertinent test results  Airway Mallampati: II  TM Distance: >3 FB     Dental no notable dental hx.    Pulmonary asthma , COPD, Current Smoker,           Cardiovascular hypertension,  Rhythm:Regular Rate:Normal     Neuro/Psych    GI/Hepatic Neg liver ROS, GERD  ,  Endo/Other  diabetes  Renal/GU negative Renal ROS     Musculoskeletal   Abdominal   Peds  Hematology   Anesthesia Other Findings   Reproductive/Obstetrics                             Anesthesia Physical Anesthesia Plan  ASA: III  Anesthesia Plan: General   Post-op Pain Management:    Induction:   Airway Management Planned: Oral ETT  Additional Equipment: Arterial line  Intra-op Plan:   Post-operative Plan: Possible Post-op intubation/ventilation  Informed Consent: I have reviewed the patients History and Physical, chart, labs and discussed the procedure including the risks, benefits and alternatives for the proposed anesthesia with the patient or authorized representative who has indicated his/her understanding and acceptance.   Dental advisory given  Plan Discussed with: CRNA and Anesthesiologist  Anesthesia Plan Comments:         Anesthesia Quick Evaluation

## 2016-08-18 ENCOUNTER — Other Ambulatory Visit: Payer: Self-pay | Admitting: *Deleted

## 2016-09-01 ENCOUNTER — Encounter: Payer: Self-pay | Admitting: Cardiology

## 2016-09-01 NOTE — Progress Notes (Signed)
I called [facility] for Ms. Andrea Horton and spoke to [place], who confirmed patient'ts transportion for am.

## 2016-09-01 NOTE — Progress Notes (Signed)
Patient report that she is to take all medications except Metformin in am. I called Gerrie NordmannStephane Dehart, she check patint's record and said to hold Pletal in am.  I informed Judeth CornfieldStephanie that patient had not been notified to pick up a Mupirocin prescription, Judeth CornfieldStephanie instructed me to message Dr Darrick PennaFields.  I sent an in box message to Dr Darrick PennaFields.

## 2016-09-02 ENCOUNTER — Encounter (HOSPITAL_COMMUNITY): Payer: Self-pay | Admitting: Certified Registered Nurse Anesthetist

## 2016-09-02 ENCOUNTER — Inpatient Hospital Stay (HOSPITAL_COMMUNITY)
Admission: RE | Admit: 2016-09-02 | Discharge: 2016-09-03 | DRG: 039 | Disposition: A | Discharge status: Home / Self Care | Payer: Medicare Other | Source: Ambulatory Visit | Attending: Vascular Surgery | Admitting: Vascular Surgery

## 2016-09-02 ENCOUNTER — Inpatient Hospital Stay (HOSPITAL_COMMUNITY): Payer: Medicare Other | Admitting: Anesthesiology

## 2016-09-02 ENCOUNTER — Encounter (HOSPITAL_COMMUNITY)
Admission: RE | Disposition: A | Discharge status: Home / Self Care | Payer: Self-pay | Source: Ambulatory Visit | Attending: Vascular Surgery

## 2016-09-02 DIAGNOSIS — J449 Chronic obstructive pulmonary disease, unspecified: Secondary | ICD-10-CM | POA: Diagnosis not present

## 2016-09-02 DIAGNOSIS — Z7984 Long term (current) use of oral hypoglycemic drugs: Secondary | ICD-10-CM

## 2016-09-02 DIAGNOSIS — K219 Gastro-esophageal reflux disease without esophagitis: Secondary | ICD-10-CM | POA: Diagnosis present

## 2016-09-02 DIAGNOSIS — Z7982 Long term (current) use of aspirin: Secondary | ICD-10-CM | POA: Diagnosis not present

## 2016-09-02 DIAGNOSIS — I6522 Occlusion and stenosis of left carotid artery: Secondary | ICD-10-CM | POA: Diagnosis not present

## 2016-09-02 DIAGNOSIS — E1151 Type 2 diabetes mellitus with diabetic peripheral angiopathy without gangrene: Secondary | ICD-10-CM | POA: Diagnosis present

## 2016-09-02 DIAGNOSIS — Z87442 Personal history of urinary calculi: Secondary | ICD-10-CM

## 2016-09-02 DIAGNOSIS — Z7951 Long term (current) use of inhaled steroids: Secondary | ICD-10-CM

## 2016-09-02 DIAGNOSIS — F17210 Nicotine dependence, cigarettes, uncomplicated: Secondary | ICD-10-CM | POA: Diagnosis present

## 2016-09-02 DIAGNOSIS — I10 Essential (primary) hypertension: Secondary | ICD-10-CM | POA: Diagnosis not present

## 2016-09-02 DIAGNOSIS — Z8249 Family history of ischemic heart disease and other diseases of the circulatory system: Secondary | ICD-10-CM

## 2016-09-02 HISTORY — PX: PATCH ANGIOPLASTY: SHX6230

## 2016-09-02 HISTORY — PX: ENDARTERECTOMY: SHX5162

## 2016-09-02 LAB — GLUCOSE, CAPILLARY
GLUCOSE-CAPILLARY: 149 mg/dL — AB (ref 65–99)
GLUCOSE-CAPILLARY: 162 mg/dL — AB (ref 65–99)
GLUCOSE-CAPILLARY: 259 mg/dL — AB (ref 65–99)
Glucose-Capillary: 99 mg/dL (ref 65–99)

## 2016-09-02 LAB — COMPREHENSIVE METABOLIC PANEL
ALBUMIN: 4 g/dL (ref 3.5–5.0)
ALK PHOS: 57 U/L (ref 38–126)
ALT: 10 U/L — AB (ref 14–54)
AST: 10 U/L — AB (ref 15–41)
Anion gap: 10 (ref 5–15)
BUN: 17 mg/dL (ref 6–20)
CALCIUM: 9.5 mg/dL (ref 8.9–10.3)
CHLORIDE: 103 mmol/L (ref 101–111)
CO2: 26 mmol/L (ref 22–32)
CREATININE: 0.87 mg/dL (ref 0.44–1.00)
GFR calc non Af Amer: >60 mL/min (ref >60)
GLUCOSE: 101 mg/dL — AB (ref 65–99)
Potassium: 3.6 mmol/L (ref 3.5–5.1)
SODIUM: 139 mmol/L (ref 135–145)
Total Bilirubin: 0.6 mg/dL (ref 0.3–1.2)
Total Protein: 7 g/dL (ref 6.5–8.1)

## 2016-09-02 LAB — TYPE AND SCREEN
ABO/RH(D): AB POS
Antibody Screen: NEGATIVE

## 2016-09-02 LAB — CBC
HCT: 36.7 % (ref 36.0–46.0)
HEMOGLOBIN: 12.4 g/dL (ref 12.0–15.0)
MCH: 28.8 pg (ref 26.0–34.0)
MCHC: 33.8 g/dL (ref 30.0–36.0)
MCV: 85.2 fL (ref 78.0–100.0)
PLATELETS: 244 10*3/uL (ref 150–400)
RBC: 4.31 MIL/uL (ref 3.87–5.11)
RDW: 14.4 % (ref 11.5–15.5)
WBC: 12.7 10*3/uL — AB (ref 4.0–10.5)

## 2016-09-02 LAB — APTT: APTT: 37 s — AB (ref 24–36)

## 2016-09-02 LAB — PROTIME-INR
INR: 1.01
Prothrombin Time: 13.3 seconds (ref 11.4–15.2)

## 2016-09-02 SURGERY — ENDARTERECTOMY, CAROTID
Anesthesia: General | Site: Neck | Laterality: Left

## 2016-09-02 MED ORDER — LABETALOL HCL 5 MG/ML IV SOLN
INTRAVENOUS | Status: DC | PRN
  Administered 2016-09-02: 5 mg via INTRAVENOUS
  Administered 2016-09-02: 10 mg via INTRAVENOUS
  Administered 2016-09-02: 5 mg via INTRAVENOUS

## 2016-09-02 MED ORDER — OXYCODONE-ACETAMINOPHEN 5-325 MG PO TABS
ORAL_TABLET | ORAL | Status: AC
  Administered 2016-09-02: 2 via ORAL
  Filled 2016-09-02: qty 2

## 2016-09-02 MED ORDER — DEXTROSE 5 % IV SOLN
INTRAVENOUS | Status: DC | PRN
  Administered 2016-09-02: 30 ug/min via INTRAVENOUS

## 2016-09-02 MED ORDER — SODIUM CHLORIDE 0.9 % IV SOLN
0.0125 ug/kg/min | Freq: Once | INTRAVENOUS | Status: DC
  Filled 2016-09-02: qty 2000

## 2016-09-02 MED ORDER — FENTANYL CITRATE (PF) 100 MCG/2ML IJ SOLN
25.0000 ug | INTRAMUSCULAR | Status: DC | PRN
  Administered 2016-09-02: 50 ug via INTRAVENOUS

## 2016-09-02 MED ORDER — SODIUM CHLORIDE 0.45 % IV SOLN
INTRAVENOUS | Status: DC
  Administered 2016-09-02: 19:00:00 via INTRAVENOUS

## 2016-09-02 MED ORDER — DEXTROSE 5 % IV SOLN
INTRAVENOUS | Status: AC
  Filled 2016-09-02: qty 1.5

## 2016-09-02 MED ORDER — ONDANSETRON HCL 4 MG/2ML IJ SOLN: 4.0000 mg | Freq: Once | INTRAMUSCULAR | Status: DC | PRN

## 2016-09-02 MED ORDER — FENTANYL CITRATE (PF) 100 MCG/2ML IJ SOLN: INTRAMUSCULAR | Status: DC | PRN

## 2016-09-02 MED ORDER — MAGNESIUM SULFATE 2 GM/50ML IV SOLN: 2.0000 g | Freq: Every day | INTRAVENOUS | Status: DC | PRN

## 2016-09-02 MED ORDER — LIDOCAINE HCL (CARDIAC) 20 MG/ML IV SOLN
INTRAVENOUS | Status: DC | PRN
  Administered 2016-09-02: 60 mg via INTRAVENOUS

## 2016-09-02 MED ORDER — ASPIRIN 81 MG PO CHEW
81.0000 mg | CHEWABLE_TABLET | Freq: Every day | ORAL | Status: DC
  Administered 2016-09-03: 81 mg via ORAL
  Filled 2016-09-02: qty 1

## 2016-09-02 MED ORDER — METOPROLOL TARTRATE 5 MG/5ML IV SOLN: 2.0000 mg | INTRAVENOUS | Status: DC | PRN

## 2016-09-02 MED ORDER — OXYCODONE-ACETAMINOPHEN 5-325 MG PO TABS
1.0000 | ORAL_TABLET | ORAL | Status: DC | PRN
  Administered 2016-09-02: 2 via ORAL

## 2016-09-02 MED ORDER — ACETAMINOPHEN 500 MG PO TABS: 1000.0000 mg | ORAL_TABLET | Freq: Four times a day (QID) | ORAL | Status: DC | PRN

## 2016-09-02 MED ORDER — ONDANSETRON HCL 4 MG/2ML IJ SOLN
INTRAMUSCULAR | Status: DC | PRN
Start: 2016-09-02 — End: 2016-09-02
  Administered 2016-09-02: 4 mg via INTRAVENOUS

## 2016-09-02 MED ORDER — CHLORHEXIDINE GLUCONATE CLOTH 2 % EX PADS: 6.0000 | MEDICATED_PAD | Freq: Once | CUTANEOUS | Status: DC

## 2016-09-02 MED ORDER — MORPHINE SULFATE (PF) 2 MG/ML IV SOLN: 2.0000 mg | INTRAVENOUS | Status: DC | PRN

## 2016-09-02 MED ORDER — LACTATED RINGERS IV SOLN
INTRAVENOUS | Status: DC | PRN
  Administered 2016-09-02: 08:00:00 via INTRAVENOUS

## 2016-09-02 MED ORDER — SODIUM CHLORIDE 0.9 % IV SOLN
0.0125 ug/kg/min | INTRAVENOUS | Status: DC
  Administered 2016-09-02: .2 ug/kg/min via INTRAVENOUS
  Filled 2016-09-02 (×2): qty 2000

## 2016-09-02 MED ORDER — LIDOCAINE HCL (PF) 1 % IJ SOLN
INTRAMUSCULAR | Status: AC
  Filled 2016-09-02: qty 30

## 2016-09-02 MED ORDER — PHENOL 1.4 % MT LIQD
1.0000 | OROMUCOSAL | Status: DC | PRN
Start: 2016-09-02 — End: 2016-09-03

## 2016-09-02 MED ORDER — OXYCODONE HCL 5 MG/5ML PO SOLN: 5.0000 mg | Freq: Once | ORAL | Status: DC | PRN

## 2016-09-02 MED ORDER — GUAIFENESIN-DM 100-10 MG/5ML PO SYRP: 15.0000 mL | ORAL_SOLUTION | ORAL | Status: DC | PRN

## 2016-09-02 MED ORDER — PANTOPRAZOLE SODIUM 40 MG PO TBEC
40.0000 mg | DELAYED_RELEASE_TABLET | Freq: Every day | ORAL | Status: DC
  Administered 2016-09-02 – 2016-09-03 (×2): 40 mg via ORAL
  Filled 2016-09-02: qty 1

## 2016-09-02 MED ORDER — FENTANYL CITRATE (PF) 100 MCG/2ML IJ SOLN
INTRAMUSCULAR | Status: AC
  Filled 2016-09-02: qty 4

## 2016-09-02 MED ORDER — ENOXAPARIN SODIUM 40 MG/0.4ML ~~LOC~~ SOLN: 40.0000 mg | SUBCUTANEOUS | Status: DC

## 2016-09-02 MED ORDER — PROPOFOL 10 MG/ML IV BOLUS
INTRAVENOUS | Status: AC
  Filled 2016-09-02: qty 20

## 2016-09-02 MED ORDER — EPHEDRINE 5 MG/ML INJ
INTRAVENOUS | Status: AC
  Filled 2016-09-02: qty 10

## 2016-09-02 MED ORDER — MIDAZOLAM HCL 5 MG/5ML IJ SOLN
INTRAMUSCULAR | Status: DC | PRN
  Administered 2016-09-02: 1 mg via INTRAVENOUS

## 2016-09-02 MED ORDER — LABETALOL HCL 5 MG/ML IV SOLN
10.0000 mg | INTRAVENOUS | Status: AC | PRN
  Administered 2016-09-02 (×4): 10 mg via INTRAVENOUS
  Filled 2016-09-02 (×4): qty 4

## 2016-09-02 MED ORDER — MUPIROCIN 2 % EX OINT
1.0000 "application " | TOPICAL_OINTMENT | Freq: Once | CUTANEOUS | Status: DC
  Filled 2016-09-02: qty 22

## 2016-09-02 MED ORDER — INSULIN ASPART 100 UNIT/ML ~~LOC~~ SOLN
0.0000 [IU] | Freq: Every day | SUBCUTANEOUS | Status: DC
  Administered 2016-09-02: 3 [IU] via SUBCUTANEOUS

## 2016-09-02 MED ORDER — OXYCODONE HCL 5 MG PO TABS: 5.0000 mg | ORAL_TABLET | Freq: Once | ORAL | Status: DC | PRN

## 2016-09-02 MED ORDER — ALUM & MAG HYDROXIDE-SIMETH 200-200-20 MG/5ML PO SUSP: 15.0000 mL | ORAL | Status: DC | PRN

## 2016-09-02 MED ORDER — DOCUSATE SODIUM 100 MG PO CAPS
100.0000 mg | ORAL_CAPSULE | Freq: Every day | ORAL | Status: DC
  Administered 2016-09-03: 100 mg via ORAL
  Filled 2016-09-02: qty 1

## 2016-09-02 MED ORDER — MOMETASONE FURO-FORMOTEROL FUM 100-5 MCG/ACT IN AERO
2.0000 | INHALATION_SPRAY | Freq: Two times a day (BID) | RESPIRATORY_TRACT | Status: DC
  Administered 2016-09-02 – 2016-09-03 (×2): 2 via RESPIRATORY_TRACT
  Filled 2016-09-02: qty 8.8

## 2016-09-02 MED ORDER — LIDOCAINE 2% (20 MG/ML) 5 ML SYRINGE
INTRAMUSCULAR | Status: AC
  Filled 2016-09-02: qty 10

## 2016-09-02 MED ORDER — ROCURONIUM BROMIDE 50 MG/5ML IV SOSY
PREFILLED_SYRINGE | INTRAVENOUS | Status: AC
  Filled 2016-09-02: qty 5

## 2016-09-02 MED ORDER — HEPARIN SODIUM (PORCINE) 1000 UNIT/ML IJ SOLN
INTRAMUSCULAR | Status: AC
  Filled 2016-09-02: qty 1

## 2016-09-02 MED ORDER — MIDAZOLAM HCL 2 MG/2ML IJ SOLN
INTRAMUSCULAR | Status: AC
  Filled 2016-09-02: qty 2

## 2016-09-02 MED ORDER — METFORMIN HCL 500 MG PO TABS
500.0000 mg | ORAL_TABLET | Freq: Two times a day (BID) | ORAL | Status: DC
  Administered 2016-09-03: 500 mg via ORAL
  Filled 2016-09-02: qty 1

## 2016-09-02 MED ORDER — SODIUM CHLORIDE 0.9 % IV SOLN: 500.0000 mL | Freq: Once | INTRAVENOUS | Status: DC | PRN

## 2016-09-02 MED ORDER — ONDANSETRON HCL 4 MG/2ML IJ SOLN: 4.0000 mg | Freq: Four times a day (QID) | INTRAMUSCULAR | Status: DC | PRN

## 2016-09-02 MED ORDER — POTASSIUM CHLORIDE CRYS ER 20 MEQ PO TBCR: 20.0000 meq | EXTENDED_RELEASE_TABLET | Freq: Every day | ORAL | Status: DC | PRN

## 2016-09-02 MED ORDER — DEXTROSE 5 % IV SOLN
1.5000 g | Freq: Two times a day (BID) | INTRAVENOUS | Status: AC
  Administered 2016-09-02 – 2016-09-03 (×2): 1.5 g via INTRAVENOUS
  Filled 2016-09-02 (×2): qty 1.5

## 2016-09-02 MED ORDER — SUGAMMADEX SODIUM 200 MG/2ML IV SOLN
INTRAVENOUS | Status: DC | PRN
  Administered 2016-09-02: 200 mg via INTRAVENOUS

## 2016-09-02 MED ORDER — ROCURONIUM BROMIDE 100 MG/10ML IV SOLN
INTRAVENOUS | Status: DC | PRN
  Administered 2016-09-02: 20 mg via INTRAVENOUS
  Administered 2016-09-02: 50 mg via INTRAVENOUS

## 2016-09-02 MED ORDER — 0.9 % SODIUM CHLORIDE (POUR BTL) OPTIME
TOPICAL | Status: DC | PRN
  Administered 2016-09-02 (×2): 1000 mL

## 2016-09-02 MED ORDER — ALBUTEROL SULFATE (2.5 MG/3ML) 0.083% IN NEBU: 3.0000 mL | INHALATION_SOLUTION | Freq: Four times a day (QID) | RESPIRATORY_TRACT | Status: DC | PRN

## 2016-09-02 MED ORDER — PHENYLEPHRINE 40 MCG/ML (10ML) SYRINGE FOR IV PUSH (FOR BLOOD PRESSURE SUPPORT)
PREFILLED_SYRINGE | INTRAVENOUS | Status: AC
  Filled 2016-09-02: qty 10

## 2016-09-02 MED ORDER — DEXTROSE 5 % IV SOLN
1.5000 g | INTRAVENOUS | Status: AC
  Administered 2016-09-02: 1.5 g via INTRAVENOUS

## 2016-09-02 MED ORDER — FENTANYL CITRATE (PF) 100 MCG/2ML IJ SOLN
INTRAMUSCULAR | Status: AC
  Administered 2016-09-02: 50 ug via INTRAVENOUS
  Filled 2016-09-02: qty 2

## 2016-09-02 MED ORDER — HEPARIN SODIUM (PORCINE) 1000 UNIT/ML IJ SOLN
INTRAMUSCULAR | Status: DC | PRN
  Administered 2016-09-02 (×2): 7000 [IU] via INTRAVENOUS

## 2016-09-02 MED ORDER — EPHEDRINE SULFATE 50 MG/ML IJ SOLN
INTRAMUSCULAR | Status: DC | PRN
  Administered 2016-09-02 (×8): 5 mg via INTRAVENOUS

## 2016-09-02 MED ORDER — PROTAMINE SULFATE 10 MG/ML IV SOLN
INTRAVENOUS | Status: DC | PRN
  Administered 2016-09-02: 50 mg via INTRAVENOUS

## 2016-09-02 MED ORDER — SIMVASTATIN 20 MG PO TABS
20.0000 mg | ORAL_TABLET | Freq: Every day | ORAL | Status: DC
  Administered 2016-09-03: 20 mg via ORAL
  Filled 2016-09-02: qty 1

## 2016-09-02 MED ORDER — AMLODIPINE BESYLATE 10 MG PO TABS
10.0000 mg | ORAL_TABLET | Freq: Every day | ORAL | Status: DC
  Administered 2016-09-03: 10 mg via ORAL
  Filled 2016-09-02: qty 1

## 2016-09-02 MED ORDER — INSULIN ASPART 100 UNIT/ML ~~LOC~~ SOLN
0.0000 [IU] | Freq: Three times a day (TID) | SUBCUTANEOUS | Status: DC
  Administered 2016-09-03: 2 [IU] via SUBCUTANEOUS

## 2016-09-02 MED ORDER — ESMOLOL HCL 100 MG/10ML IV SOLN
INTRAVENOUS | Status: DC | PRN
  Administered 2016-09-02: 60 mg via INTRAVENOUS
  Administered 2016-09-02: 40 mg via INTRAVENOUS

## 2016-09-02 MED ORDER — IPRATROPIUM-ALBUTEROL 0.5-2.5 (3) MG/3ML IN SOLN
3.0000 mL | Freq: Four times a day (QID) | RESPIRATORY_TRACT | Status: DC | PRN
  Filled 2016-09-02: qty 3

## 2016-09-02 MED ORDER — SODIUM CHLORIDE 0.9 % IV SOLN
INTRAVENOUS | Status: DC | PRN
  Administered 2016-09-02: 09:00:00

## 2016-09-02 MED ORDER — PROPOFOL 10 MG/ML IV BOLUS
INTRAVENOUS | Status: DC | PRN
  Administered 2016-09-02: 170 mg via INTRAVENOUS

## 2016-09-02 MED ORDER — FENTANYL CITRATE (PF) 100 MCG/2ML IJ SOLN
INTRAMUSCULAR | Status: DC | PRN
  Administered 2016-09-02 (×4): 50 ug via INTRAVENOUS

## 2016-09-02 MED ORDER — SUCCINYLCHOLINE CHLORIDE 200 MG/10ML IV SOSY
PREFILLED_SYRINGE | INTRAVENOUS | Status: AC
  Filled 2016-09-02: qty 10

## 2016-09-02 MED ORDER — HEMOSTATIC AGENTS (NO CHARGE) OPTIME
TOPICAL | Status: DC | PRN
  Administered 2016-09-02: 1 via TOPICAL

## 2016-09-02 MED ORDER — HYDRALAZINE HCL 20 MG/ML IJ SOLN
5.0000 mg | INTRAMUSCULAR | Status: AC | PRN
  Administered 2016-09-02 – 2016-09-03 (×2): 5 mg via INTRAVENOUS
  Filled 2016-09-02 (×2): qty 1

## 2016-09-02 MED ORDER — ASPIRIN 81 MG PO TABS: 81.0000 mg | ORAL_TABLET | Freq: Every day | ORAL | Status: DC

## 2016-09-02 SURGICAL SUPPLY — 52 items
CANISTER SUCTION 2500CC (MISCELLANEOUS) ×2 IMPLANT
CANNULA VESSEL 3MM 2 BLNT TIP (CANNULA) ×4 IMPLANT
CATH ROBINSON RED A/P 18FR (CATHETERS) ×2 IMPLANT
CLIP TI MEDIUM 6 (CLIP) ×2 IMPLANT
CLIP TI WIDE RED SMALL 6 (CLIP) ×2 IMPLANT
CRADLE DONUT ADULT HEAD (MISCELLANEOUS) ×2 IMPLANT
DECANTER SPIKE VIAL GLASS SM (MISCELLANEOUS) IMPLANT
DERMABOND ADVANCED (GAUZE/BANDAGES/DRESSINGS) ×1
DERMABOND ADVANCED .7 DNX12 (GAUZE/BANDAGES/DRESSINGS) ×1 IMPLANT
DRAIN HEMOVAC 1/8 X 5 (WOUND CARE) IMPLANT
ELECT REM PT RETURN 9FT ADLT (ELECTROSURGICAL) ×2
ELECTRODE REM PT RTRN 9FT ADLT (ELECTROSURGICAL) ×1 IMPLANT
EVACUATOR SILICONE 100CC (DRAIN) IMPLANT
GAUZE SPONGE 4X4 16PLY XRAY LF (GAUZE/BANDAGES/DRESSINGS) ×2 IMPLANT
GLOVE BIO SURGEON STRL SZ 6.5 (GLOVE) ×2 IMPLANT
GLOVE BIO SURGEON STRL SZ7.5 (GLOVE) ×4 IMPLANT
GLOVE BIOGEL PI IND STRL 6.5 (GLOVE) ×5 IMPLANT
GLOVE BIOGEL PI IND STRL 7.0 (GLOVE) ×1 IMPLANT
GLOVE BIOGEL PI IND STRL 7.5 (GLOVE) ×1 IMPLANT
GLOVE BIOGEL PI INDICATOR 6.5 (GLOVE) ×5
GLOVE BIOGEL PI INDICATOR 7.0 (GLOVE) ×1
GLOVE BIOGEL PI INDICATOR 7.5 (GLOVE) ×1
GLOVE SURG SS PI 6.5 STRL IVOR (GLOVE) ×4 IMPLANT
GOWN STRL NON-REIN LRG LVL3 (GOWN DISPOSABLE) ×2 IMPLANT
GOWN STRL REUS W/ TWL LRG LVL3 (GOWN DISPOSABLE) ×4 IMPLANT
GOWN STRL REUS W/ TWL XL LVL3 (GOWN DISPOSABLE) ×2 IMPLANT
GOWN STRL REUS W/TWL LRG LVL3 (GOWN DISPOSABLE) ×4
GOWN STRL REUS W/TWL XL LVL3 (GOWN DISPOSABLE) ×2
HEMOSTAT SPONGE AVITENE ULTRA (HEMOSTASIS) ×2 IMPLANT
KIT BASIN OR (CUSTOM PROCEDURE TRAY) ×2 IMPLANT
KIT ROOM TURNOVER OR (KITS) ×2 IMPLANT
LOOP VESSEL MINI RED (MISCELLANEOUS) IMPLANT
NEEDLE HYPO 25GX1X1/2 BEV (NEEDLE) IMPLANT
NS IRRIG 1000ML POUR BTL (IV SOLUTION) ×4 IMPLANT
PACK CAROTID (CUSTOM PROCEDURE TRAY) ×2 IMPLANT
PAD ARMBOARD 7.5X6 YLW CONV (MISCELLANEOUS) ×4 IMPLANT
PATCH HEMASHIELD 8X150 (Vascular Products) ×2 IMPLANT
PATCH HEMASHIELD 8X75 (Vascular Products) IMPLANT
SHUNT CAROTID BYPASS 10 (VASCULAR PRODUCTS) ×2 IMPLANT
SHUNT CAROTID BYPASS 12FRX15.5 (VASCULAR PRODUCTS) IMPLANT
SUT ETHILON 3 0 PS 1 (SUTURE) IMPLANT
SUT PROLENE 5 0 C 1 24 (SUTURE) ×2 IMPLANT
SUT PROLENE 6 0 CC (SUTURE) ×10 IMPLANT
SUT PROLENE 7 0 BV 1 (SUTURE) IMPLANT
SUT PROLENE 7 0 BV1 MDA (SUTURE) IMPLANT
SUT SILK 3 0 TIES 17X18 (SUTURE)
SUT SILK 3-0 18XBRD TIE BLK (SUTURE) IMPLANT
SUT VIC AB 3-0 SH 27 (SUTURE) ×1
SUT VIC AB 3-0 SH 27X BRD (SUTURE) ×1 IMPLANT
SUT VICRYL 4-0 PS2 18IN ABS (SUTURE) ×2 IMPLANT
SYR CONTROL 10ML LL (SYRINGE) IMPLANT
WATER STERILE IRR 1000ML POUR (IV SOLUTION) ×2 IMPLANT

## 2016-09-02 NOTE — Progress Notes (Signed)
Patient has a CBG of 259 after first meal post op. Patient did not receive BID metformin earlier due to being NPO. Fields, MD paged and gave a verbal order for moderate sliding scale. Will administer 3 units of novolog per sliding scale. Will continue to monitor patient.

## 2016-09-02 NOTE — Anesthesia Procedure Notes (Deleted)
Performed by: WHITE, Jamarri Vuncannon TENA Zinia Innocent       

## 2016-09-02 NOTE — Anesthesia Procedure Notes (Signed)
Procedure Name: Intubation Date/Time: 09/02/2016 8:39 AM Performed by: Salli Quarry Dorraine Ellender Pre-anesthesia Checklist: Patient identified, Emergency Drugs available, Suction available and Patient being monitored Patient Re-evaluated:Patient Re-evaluated prior to inductionOxygen Delivery Method: Circle system utilized Preoxygenation: Pre-oxygenation with 100% oxygen Intubation Type: IV induction Ventilation: Mask ventilation without difficulty Laryngoscope Size: Mac and 4 Grade View: Grade II Tube type: Oral Tube size: 7.5 mm Number of attempts: 2 (First with Miller blade, then MAC) Airway Equipment and Method: Stylet Placement Confirmation: ETT inserted through vocal cords under direct vision Secured at: 23 cm Tube secured with: Tape Dental Injury: Teeth and Oropharynx as per pre-operative assessment  Comments: ETT placed by C. Suzan Slick, [place].

## 2016-09-02 NOTE — H&P (Signed)
Patient name: Andrea Horton MRN: [id number]        DOB: [birth year]          Sex: female     HPI: Andrea Horton is a 66 y.o. female who we have been following for peripheral arterial disease. She was noted on recent physical exam to have a left-sided carotid bruit. Subsequent carotid duplex scan showed a high-grade greater than 80% left internal carotid artery stenosis. The patient denies any symptoms of amaurosis. However on detailed questioning, she has had several episodes recently where she had weakness in her right arm dropping things and occasional numbness and tingling in her right arm. She denies prior history of stroke. She is on aspirin daily. She is also on a statin. She was recently put on Pletal for her chronic right leg claudication which has helped her right leg somewhat. She currently does not really complain of claudication symptoms. She currently smokes 1-2 packs of cigarettes per day. Greater than 3 minutes today regarding smoking cessation counseling. Other medical problems include COPD and diabetes but of which are currently stable.       Past Medical History:  Diagnosis Date  . COPD (chronic obstructive pulmonary disease) (HCC)    . Diabetes mellitus without complication (HCC)      No past surgical history on file.         Family History  Problem Relation Age of Onset  . Heart disease Mother        before age 66  . Heart disease Sister        before age 66  . Heart disease Brother        before age 66      SOCIAL HISTORY: Social History         Social History  . Marital status: Single      Spouse name: N/A  . Number of children: N/A  . Years of education: N/A       Occupational History  . Not on file.         Social History Main Topics  . Smoking status: Current Every Day Smoker      Packs/day: 1.00      Years: 35.00      Types: Cigarettes  . Smokeless tobacco: Never Used  . Alcohol use No  . Drug use: No  . Sexual activity: Not on file        Other Topics Concern  . Not on file       Social History Narrative  . No narrative on file      No Known Allergies         Current Outpatient Prescriptions  Medication Sig Dispense Refill  . albuterol (PROVENTIL HFA;VENTOLIN HFA) 108 (90 Base) MCG/ACT inhaler Inhale into the lungs every 6 (six) hours as needed for wheezing or shortness of breath.      Marland Kitchen. amLODipine (NORVASC) 10 MG tablet Take 10 mg by mouth daily.      Marland Kitchen. aspirin 81 MG tablet Take 81 mg by mouth daily.      . cilostazol (PLETAL) 100 MG tablet Take 100 mg by mouth 2 (two) times daily.      . Fluticasone-Salmeterol (ADVAIR DISKUS) 100-50 MCG/DOSE AEPB Inhale 1 puff into the lungs 2 (two) times daily.      . metFORMIN (GLUCOPHAGE) 500 MG tablet Take 500 mg by mouth 2 (two) times daily.   3  . simvastatin (ZOCOR) 20 MG  tablet Take 20 mg by mouth daily.   3    No current facility-administered medications for this visit.       ROS:    General:  No weight loss, Fever, chills   HEENT: No recent headaches, no nasal bleeding, no visual changes, no sore throat   Neurologic: No dizziness, blackouts, seizures. No recent symptoms of stroke or mini- stroke. No recent episodes of slurred speech, or temporary blindness.   Cardiac: No recent episodes of chest pain/pressure, no shortness of breath at rest.  No shortness of breath with exertion.  Denies history of atrial fibrillation or irregular heartbeat   Vascular: No history of rest pain in feet.  No history of claudication.  No history of non-healing ulcer, No history of DVT    Pulmonary: No home oxygen, no productive cough, no hemoptysis,  No asthma or wheezing   Musculoskeletal:  [ ]  Arthritis, [ ]  Low back pain,  [ ]  Joint pain   Hematologic:No history of hypercoagulable state.  No history of easy bleeding.  No history of anemia   Gastrointestinal: No hematochezia or melena,  No gastroesophageal reflux, no trouble swallowing   Urinary: [ ]  chronic Kidney disease,  [ ]  on HD - [ ]  MWF or [ ]  TTHS, [ ]  Burning with urination, [ ]  Frequent urination, [ ]  Difficulty urinating;    Skin: No rashes   Psychological: No history of anxiety,  No history of depression     Physical Examination  Vitals:   09/02/16 0659  BP: (!) 156/60  Pulse: 70  Resp: 18  Temp: 98.2 F (36.8 C)  TempSrc: Oral  SpO2: 98%  Weight: 153 lb (69.4 kg)     General:  Alert and oriented, no acute distress HEENT: Normal Neck: harsh left bruit Pulmonary: Clear to auscultation bilaterally Cardiac: Regular Rate and Rhythm without murmur Abdomen: Soft, non-tender, non-distended, no mass Skin: No rash Extremity Pulses:  2+ radial, brachial, femoral pulses bilaterally Musculoskeletal: No deformity or edema      Neurologic: Upper and lower extremity motor 5/5 and symmetric   DATA:  I reviewed the patient's recent carotid duplex scan which showed velocities in the left side of 604/250 greater than 80%, antegrade vertebral flow, less than 40% stenosis on the right side.   ASSESSMENT:  Most likely symptomatic left internal carotid artery stenosis and sounds like she is having occasional TIAs.     PLAN:  She will continue her aspirin. We will have a stress test scheduled for her in the near future. Plan is for left carotid endarterectomy 08/10/2016. Risks benefits possible complications procedure details including but not limited to bleeding infection cranial nerve injury 10-15%, stroke risk 1% were explained the patient today. She understands and agrees to proceed     Fabienne Bruns, MD [facility] [place] Office: [phone] Pager: [phone]

## 2016-09-02 NOTE — Anesthesia Postprocedure Evaluation (Signed)
Anesthesia Post Note  Patient: Andrea Horton  Procedure(s) Performed: Procedure(s) (LRB): ENDARTERECTOMY CAROTID LEFT (Left) PATCH ANGIOPLASTY LEFT CAROTID ARTERY (Left)  Patient location during evaluation: PACU Anesthesia Type: General Level of consciousness: awake, awake and alert and oriented Pain management: pain level controlled Vital Signs Assessment: post-procedure vital signs reviewed and stable Respiratory status: spontaneous breathing, nonlabored ventilation and respiratory function stable Cardiovascular status: blood pressure returned to baseline Postop Assessment: no headache Anesthetic complications: no       Last Vitals:  Vitals:   09/02/16 1945 09/02/16 2000  BP: 109/85 140/69  Pulse: 69 70  Resp: 17 17  Temp:  36.6 C    Last Pain:  Vitals:   09/02/16 2000  TempSrc: Oral  PainSc:                  Andrea Horton

## 2016-09-02 NOTE — Transfer of Care (Signed)
Immediate Anesthesia Transfer of Care Note  Patient: Andrea Horton Camey  Procedure(s) Performed: Procedure(s): ENDARTERECTOMY CAROTID LEFT (Left) PATCH ANGIOPLASTY LEFT CAROTID ARTERY (Left)  Patient Location: PACU  Anesthesia Type:General  Level of Consciousness: awake, alert  and patient cooperative  Airway & Oxygen Therapy: Patient Spontanous Breathing and Patient connected to nasal cannula oxygen  Post-op Assessment: Report given to RN and Post -op Vital signs reviewed and stable  Post vital signs: Reviewed and stable  Last Vitals:  Vitals:   09/02/16 0659  BP: (!) 156/60  Pulse: 70  Resp: 18  Temp: 36.8 C    Last Pain:  Vitals:   09/02/16 0659  TempSrc: Oral      Patients Stated Pain Goal: 4 (09/02/16 [id number])  Complications: No apparent anesthesia complications

## 2016-09-02 NOTE — Progress Notes (Signed)
PRN dose of Labetalol given for BP of 165/71 (67)    09/02/16 2300  Vitals  BP (!) 165/71  MAP (mmHg) 97  Pulse Rate 78  ECG Heart Rate 78  Resp 13  Oxygen Therapy  SpO2 98 %  Art Line (2)  Arterial Line BP 2 182/62  Arterial Line MAP (mmHg) 101 mmHg

## 2016-09-02 NOTE — Anesthesia Preprocedure Evaluation (Signed)
Anesthesia Evaluation  Patient identified by MRN, date of birth, ID band Patient awake    Reviewed: Allergy & Precautions, NPO status , Patient's Chart, lab work & pertinent test results  Airway Mallampati: II  TM Distance: >3 FB Neck ROM: Full    Dental  (+) Teeth Intact, Dental Advisory Given   Pulmonary Current Smoker,    breath sounds clear to auscultation       Cardiovascular hypertension,  Rhythm:Regular Rate:Normal     Neuro/Psych    GI/Hepatic   Endo/Other  diabetes  Renal/GU      Musculoskeletal   Abdominal   Peds  Hematology   Anesthesia Other Findings   Reproductive/Obstetrics                             Anesthesia Physical Anesthesia Plan  ASA: III  Anesthesia Plan: General   Post-op Pain Management:    Induction: Intravenous  Airway Management Planned: Oral ETT  Additional Equipment: Arterial line  Intra-op Plan:   Post-operative Plan:   Informed Consent: I have reviewed the patients History and Physical, chart, labs and discussed the procedure including the risks, benefits and alternatives for the proposed anesthesia with the patient or authorized representative who has indicated his/her understanding and acceptance.   Dental advisory given  Plan Discussed with: CRNA and Anesthesiologist  Anesthesia Plan Comments:         Anesthesia Quick Evaluation

## 2016-09-02 NOTE — Op Note (Signed)
Procedure Lett carotid endarterectomy  Preoperative diagnosis: Asymptomatic >89% left internal carotid artery stenosis  Postoperative diagnosis: Same  Anesthesia General  Asst.: Karsten RoKim Trinh, [facility]  Operative findings: #1 greater than 90% left internal carotid stenosis extending about 4 cm into the internal carotid                                #2 Dacron patch           #3 unable to place shunt due to distal ICA narrowing  Operative details: After obtaining informed consent, the patient was taken to the operating room. The patient was placed in a supine position on the operating room table. After induction of general anesthesia the patient's entire neck and chest was prepped and draped in the usual sterile fashion. An oblique incision was made on the left aspect of the patient's neck anterior to the border the left sternocleidomastoid muscle. The incision was carried into the subcutaneous tissues and through the platysma. The sternocleidomastoid muscle was identified and reflected laterally. The common facial vein was identified and dissected free circumferentially then ligated and divided between silk ties. The common carotid artery was then found at the base of the incision this was dissected free circumferentially. It was fairly soft on palpation but thickened.  The vagus nerve was identified and protected. Dissection was then carried up to the level carotid bifurcation.  There was a nerve crossing over this and after clearly identifying the hypoglossal and vagus nerve which were separate from this it is was divided.  There was inflammation and scarring of the distal internal carotid area which made dissection and identification of structures more difficult.  A tie came off the common facial vein and the internal jugular was repaired with a running 5 0 prolene suture.  The hyperglossal nerve was well above the primary area of dissection. It was identified and protected.  The internal carotid artery was  dissected free circumferentially just below the level of the hypoglossal nerve and it was soft in character at this location but again thickened and there was a yellowish colored portion suggesting a distal tandem lesion.  I dissected out the artery several centimeters above this and above any palpable disease. A vessel loop was placed around this. Next the external carotid and superior thyroid arteries were dissected free circumferentially and vessel loops were placed around these. The patient was given 7000 units of intravenous heparin.  After 2 minutes of circulation time and raising the mean arterial pressure to 90 mm mercury, the distal internal carotid artery was controlled with small bulldog clamp. The external carotid and superior thyroid arteries were controlled with vessel loops. The common carotid artery was controlled with a peripheral DeBakey clamp. A longitudinal opening was made in the common carotid artery just below the bifurcation. The arteriotomy was extended distally up into the internal carotid with Potts scissors. There was a large calcified plaque with greater than 90% stenosis in the internal carotid.  A 10 Fr shunt was brought onto the field and fashioned to fit the patient's artery.  This was threaded into the distal internal carotid artery but would not pass fully into the internal after about 2 cm due to resistance so I aborted the shunt. There was pulsatile backbleeding.  While clamped the mean arterial pressure was kept 85-100 to promote cross filling. Attention was then turned to the common carotid artery once again. A suitable endarterectomy plane  was obtained and endarterectomy was begun in the common carotid artery and a proximal endpoint was difficult to obtain due to severe thickened intimal disease.  I had to move the proximal clamp down twice to finally obtain a reasonable endpoint. An eversion endarterectomy was performed on the external carotid artery and a good endpoint was  obtained. The plaque was then elevated in the internal carotid artery where there was a tandem lesion with a long tongue of posterior plaque. A nice feathered distal endpoint was also obtained.  The plaque was passed off the table. All loose debris was then removed from the carotid bed and everything was thoroughly irrigated with heparinized saline. The patient was given an additional 7000 units of heparin.  A Dacron patch was then brought on to the operative field and this was sewn on as a patch angioplasty using a running 6-0 Prolene suture. Prior to completion of the anastomosis the internal carotid artery was thoroughly backbled. This was then controlled again with a fine bulldog clamp.  The common carotid was thoroughly flushed forward. The external carotid was also thoroughly backbled.  The remainder of the patch was completed and the anastomosis was secured. Flow was then restored first retrograde from the external carotid into the carotid bed then antegrade from the common carotid to the external carotid artery and after approximately 5 cardiac cycles to the internal carotid artery. Doppler was used to evaluate the external/internal and common carotid arteries and these all had good Doppler flow. Hemostasis was obtained with 2 repair sutures on the lateral distal portion of the patch as well as using avitene. The patient was also given 50 mg of Protamine.      The platysma muscle was reapproximated using a running 3-0 Vicryl suture. The skin was closed with 4 0 Vicryl subcuticular stitch.  The patient was awakened in the operating room and was moving upper and lower extremities symmetrically and following commands.  The patient was stable on arrival to the PACU.  Fabienne Bruns, MD [facility] [place] Office: [phone] Pager: [phone]

## 2016-09-02 NOTE — Progress Notes (Signed)
PRN dose of labetalol given for BP of 173/70 (97)    09/02/16 1914  Vitals  BP (!) 173/70  MAP (mmHg) 97  BP Location Right Arm  BP Method Automatic  Patient Position (if appropriate) Lying  Pulse Rate 73  ECG Heart Rate 72  Resp 18  Oxygen Therapy  SpO2 98 %  Art Line (2)  Arterial Line BP 2 191/68  Arterial Line MAP (mmHg) 112 mmHg

## 2016-09-03 ENCOUNTER — Telehealth: Payer: Self-pay | Admitting: Vascular Surgery

## 2016-09-03 ENCOUNTER — Encounter (HOSPITAL_COMMUNITY): Payer: Self-pay | Admitting: Vascular Surgery

## 2016-09-03 DIAGNOSIS — I6522 Occlusion and stenosis of left carotid artery: Secondary | ICD-10-CM | POA: Diagnosis not present

## 2016-09-03 DIAGNOSIS — I10 Essential (primary) hypertension: Secondary | ICD-10-CM | POA: Diagnosis not present

## 2016-09-03 DIAGNOSIS — F17210 Nicotine dependence, cigarettes, uncomplicated: Secondary | ICD-10-CM | POA: Diagnosis not present

## 2016-09-03 DIAGNOSIS — Z8249 Family history of ischemic heart disease and other diseases of the circulatory system: Secondary | ICD-10-CM | POA: Diagnosis not present

## 2016-09-03 DIAGNOSIS — E1151 Type 2 diabetes mellitus with diabetic peripheral angiopathy without gangrene: Secondary | ICD-10-CM | POA: Diagnosis not present

## 2016-09-03 DIAGNOSIS — J449 Chronic obstructive pulmonary disease, unspecified: Secondary | ICD-10-CM | POA: Diagnosis not present

## 2016-09-03 LAB — CBC
HCT: 31.1 % — ABNORMAL LOW (ref 36.0–46.0)
Hemoglobin: 10.6 g/dL — ABNORMAL LOW (ref 12.0–15.0)
MCH: 28.8 pg (ref 26.0–34.0)
MCHC: 34.1 g/dL (ref 30.0–36.0)
MCV: 84.5 fL (ref 78.0–100.0)
PLATELETS: 226 10*3/uL (ref 150–400)
RBC: 3.68 MIL/uL — AB (ref 3.87–5.11)
RDW: 14 % (ref 11.5–15.5)
WBC: 17.5 10*3/uL — ABNORMAL HIGH (ref 4.0–10.5)

## 2016-09-03 LAB — BASIC METABOLIC PANEL
Anion gap: 9 (ref 5–15)
BUN: 15 mg/dL (ref 6–20)
CALCIUM: 8.7 mg/dL — AB (ref 8.9–10.3)
CO2: 24 mmol/L (ref 22–32)
CREATININE: 0.71 mg/dL (ref 0.44–1.00)
Chloride: 104 mmol/L (ref 101–111)
GFR calc Af Amer: >60 mL/min (ref >60)
Glucose, Bld: 131 mg/dL — ABNORMAL HIGH (ref 65–99)
POTASSIUM: 3.4 mmol/L — AB (ref 3.5–5.1)
SODIUM: 137 mmol/L (ref 135–145)

## 2016-09-03 LAB — GLUCOSE, CAPILLARY: Glucose-Capillary: 123 mg/dL — ABNORMAL HIGH (ref 65–99)

## 2016-09-03 MED ORDER — OXYCODONE-ACETAMINOPHEN 5-325 MG PO TABS: 1.0000 | ORAL_TABLET | Freq: Four times a day (QID) | ORAL | 0 refills | Status: DC | PRN

## 2016-09-03 NOTE — Telephone Encounter (Signed)
LVM on home # mailed letter for appt on 3/1

## 2016-09-03 NOTE — Care Management Note (Signed)
Case Management Note  Patient Details  Name: Vena RuaRosetta L Perlmutter MRN: [id number] Date of Birth: Mar 10, 1951  Subjective/Objective:   S/p L CEA, she states she has a pcp, she has medication coverage and she has transportation at dc.  pta indep.  She is for dc today, no needs.                 Action/Plan:   Expected Discharge Date:  09/03/16               Expected Discharge Plan:  Home/Self Care  In-House Referral:     Discharge planning Services  CM Consult  Post Acute Care Choice:    Choice offered to:     DME Arranged:    DME Agency:     HH Arranged:    HH Agency:     Status of Service:  Completed, signed off  If discussed at [employer] Length of Stay Meetings, dates discussed:    Additional Comments:  Leone Havenaylor, Graiden Henes Clinton, RN 09/03/2016, 11:00 AM

## 2016-09-03 NOTE — Telephone Encounter (Signed)
-----   Message from Sharee PimpleMarilyn K McChesney, RN sent at 09/03/2016  9:22 AM EST ----- Regarding: schedule   ----- Message ----- From: Raymond GurneyKimberly A Trinh, PA-C Sent: 09/03/2016   6:55 AM To: [facility] Charge Pool  S/p left CEA 09/02/16  F/u with Dr. Darrick PennaFields in 2 weeks  Thanks Selena BattenKim

## 2016-09-03 NOTE — Discharge Summary (Signed)
Discharge Summary     Andrea Horton 11-27-1950 66 y.o. female  [id number]  Admission Date: 09/02/2016  Discharge Date: 09/03/16  Physician: Sherren Kernsharles E Fields, MD  Admission Diagnosis: Left carotid artery stenosis I65.22   HPI:   This is a 66 y.o. female who we have been following for peripheral arterial disease. She was noted on recent physical exam to have a left-sided carotid bruit. Subsequent carotid duplex scan showed a high-grade greater than 80% left internal carotid artery stenosis. The patient denies any symptoms of amaurosis. However on detailed questioning, she has had several episodes recently where she had weakness in her right arm dropping things and occasional numbness and tingling in her right arm. She denies prior history of stroke. She is on aspirin daily. She is also on a statin. She was recently put on Pletal for her chronic right leg claudication which has helped her right leg somewhat. She currently does not really complain of claudication symptoms. She currently smokes 1-2 packs of cigarettes per day. Greater than 3 minutes today regarding smoking cessation counseling. Other medical problems include COPD and diabetes but of which are currently stable.  Hospital Course:  The patient was admitted to the hospital and taken to the operating room on 09/02/2016 and underwent left carotid endarterectomy.  The pt tolerated the procedure well and was transported to the PACU in good condition.   By POD 1, the pt neuro status is in tact.  She was a little hypertensive.  She was given her am BP medications and her BP improved.  She was discharged home later in the day.    The remainder of the hospital course consisted of increasing mobilization and increasing intake of solids without difficulty.    Recent Labs  09/02/16 0652 09/03/16 0357  NA 139 137  K 3.6 3.4*  CL 103 104  CO2 26 24  GLUCOSE 101* 131*  BUN 17 15  CALCIUM 9.5 8.7*    Recent Labs   09/02/16 0652 09/03/16 0357  WBC 12.7* 17.5*  HGB 12.4 10.6*  HCT 36.7 31.1*  PLT 244 226    Recent Labs  09/02/16 0652  INR 1.01     Discharge Instructions    CAROTID Sugery: Call MD for difficulty swallowing or speaking; weakness in arms or legs that is a new symtom; severe headache.  If you have increased swelling in the neck and/or  are having difficulty breathing, CALL 911    Complete by:  As directed    Call MD for:  redness, tenderness, or signs of infection (pain, swelling, bleeding, redness, odor or green/yellow discharge around incision site)    Complete by:  As directed    Call MD for:  severe or increased pain, loss or decreased feeling  in affected limb(s)    Complete by:  As directed    Call MD for:  temperature >100.5    Complete by:  As directed    Discharge wound care:    Complete by:  As directed    Wash wound daily with soap and water and pat dry. Do not apply any creams or ointments on your incisions. Do not pull off the skin glue. It will peel off on its own.   Driving Restrictions    Complete by:  As directed    No driving for 2 weeks   Increase activity slowly    Complete by:  As directed    Walk with assistance use walker or cane as needed  Lifting restrictions    Complete by:  As directed    No lifting for 2 weeks   Resume previous diet    Complete by:  As directed       Discharge Diagnosis:  Left carotid artery stenosis I65.22  Secondary Diagnosis: Patient Active Problem List   Diagnosis Date Noted  . Asymptomatic carotid artery stenosis, left 09/02/2016   Past Medical History:  Diagnosis Date  . Asthma   . Bronchitis   . COPD (chronic obstructive pulmonary disease) (HCC)   . Diabetes mellitus without complication (HCC)   . GERD (gastroesophageal reflux disease)   . History of kidney stones   . Hypertension     Allergies as of 09/03/2016      Reactions   No Known Allergies       Medication List    TAKE these medications    acetaminophen 500 MG tablet Commonly known as:  TYLENOL Take 1,000 mg by mouth every 6 (six) hours as needed (for pain).   ADVAIR DISKUS 100-50 MCG/DOSE Aepb Generic drug:  Fluticasone-Salmeterol Inhale 1 puff into the lungs 2 (two) times daily.   albuterol 108 (90 Base) MCG/ACT inhaler Commonly known as:  PROVENTIL HFA;VENTOLIN HFA Inhale into the lungs every 6 (six) hours as needed for wheezing or shortness of breath.   amLODipine 10 MG tablet Commonly known as:  NORVASC Take 10 mg by mouth daily.   aspirin 81 MG tablet Take 81 mg by mouth daily.   cilostazol 100 MG tablet Commonly known as:  PLETAL Take 100 mg by mouth 2 (two) times daily.   ipratropium-albuterol 0.5-2.5 (3) MG/3ML Soln Commonly known as:  DUONEB Take 3 mLs by nebulization every 6 (six) hours as needed (for shortness of breath/wheezing/congestion).   metFORMIN 500 MG tablet Commonly known as:  GLUCOPHAGE Take 500 mg by mouth 2 (two) times daily.   oxyCODONE-acetaminophen 5-325 MG tablet Commonly known as:  ROXICET Take 1 tablet by mouth every 6 (six) hours as needed for severe pain.   simvastatin 20 MG tablet Commonly known as:  ZOCOR Take 20 mg by mouth daily.   SUPER B COMPLEX PO Take 1 tablet by mouth daily.       Prescriptions given: Roxicet #8 No Refill  Instructions: 1.  No driving x 2 weeks & while taking pain medication 2.  No heavy lifting x 2 weeks 3.  Shower daily with soap and water starting 09/04/16  Disposition: home  Patient's condition: is Good  Follow up: 1. Dr. Darrick Penna in 2 weeks. 2. Dr. Felecia Shelling in 1-2 weeks for blood pressure check and medication titration if needed.   Doreatha Massed, PA-C [facility] [phone]   --- For [facility] use ---   Modified Rankin score at D/C (0-6): 0  IV medication needed for:  1. Hypertension: No 2. Hypotension: No  Post-op Complications: No  1. Post-op CVA or TIA: No  If yes: Event classification  (right eye, left eye, right cortical, left cortical, verterobasilar, other): n/a  If yes: Timing of event (intra-op, <6 hrs post-op, >=6 hrs post-op, unknown): n/a  2. CN injury: No  If yes: CN n/a injuried   3. Myocardial infarction: No  If yes: Dx by (EKG or clinical, Troponin): n/a  4.  CHF: No  5.  Dysrhythmia (new): No  6. Wound infection: No  7. Reperfusion symptoms: No  8. Return to OR: No  If yes: return to OR for (bleeding, neurologic, other CEA incision, other):  n/a  Discharge medications: Statin use:  Yes   If No:   ASA use:  Yes  If No:   Beta blocker use:  No ACE-Inhibitor use:  No  ARB use:  No CCB use: Yes P2Y12 Antagonist use: No, [ ]  Plavix, [ ]  Plasugrel, [ ]  Ticlopinine, [ ]  Ticagrelor, [ ]  Other, [ ]  No for medical reason, [ ]  Non-compliant, [ ]  Not-indicated Anti-coagulant use:  No, [ ]  Warfarin, [ ]  Rivaroxaban, [ ]  Dabigatran,

## 2016-09-03 NOTE — Progress Notes (Addendum)
  Progress Note    09/03/2016 7:36 AM 1 Day Post-Op  Subjective:  Says she feels fine.  She has walked and voiding okay.  Pain controlled.  afebrile HR  70's-110's 1st degree 140's-170's systolic 99% RA  Vitals:   09/03/16 0333 09/03/16 0501  BP: (!) 140/52 (!) 155/62  Pulse: (!) 108 (!) 108  Resp: 13 (!) 8  Temp: 98.2 F (36.8 C)      Physical Exam: Neuro:  In tact; tongue is midline; no difficulty swallowing Lungs:  Non labored Incision:  Clean and dry  CBC    Component Value Date/Time   WBC 17.5 (H) 09/03/2016 0357   RBC 3.68 (L) 09/03/2016 0357   HGB 10.6 (L) 09/03/2016 0357   HCT 31.1 (L) 09/03/2016 0357   PLT 226 09/03/2016 0357   MCV 84.5 09/03/2016 0357   MCH 28.8 09/03/2016 0357   MCHC 34.1 09/03/2016 0357   RDW 14.0 09/03/2016 0357   LYMPHSABS 3.5 11/01/2009 1145   MONOABS 0.9 11/01/2009 1145   EOSABS 0.1 11/01/2009 1145   BASOSABS 0.1 11/01/2009 1145    BMET    Component Value Date/Time   NA 137 09/03/2016 0357   K 3.4 (L) 09/03/2016 0357   CL 104 09/03/2016 0357   CO2 24 09/03/2016 0357   GLUCOSE 131 (H) 09/03/2016 0357   BUN 15 09/03/2016 0357   CREATININE 0.71 09/03/2016 0357   CALCIUM 8.7 (L) 09/03/2016 0357   GFRNONAA >60 09/03/2016 0357   GFRAA >60 09/03/2016 0357     Intake/Output Summary (Last 24 hours) at 09/03/16 0736 Last data filed at 09/03/16 0300  Gross per 24 hour  Intake             2055 ml  Output             2325 ml  Net             -270 ml     Assessment/Plan:  This is a 66 y.o. female who is s/p left CEA 1 Day Post-Op  -pt is doing well this am. -pt neuro exam is in tact -A-line not correlating with cuff-will go by cuff pressure.  She has required prn IV for hypertension.  Will get her Norvasc this am.  Will have her f/u with Dr. Felecia ShellingFanta in 1-2 weeks for blood pressure check and possible adjustments on medications.   -pt has ambulated -pt has voided -f/u with Dr. Darrick PennaFields in 2 weeks.   Doreatha MassedSamantha Rhyne,  PA-C [facility] [phone]  Overall looks good a little hypertensive this morning Ok to d/c later today if BP 160-170 which is baseline  Fabienne Brunsharles Marylene Masek, MD [facility] [place] Office: [phone] Pager: [phone]

## 2016-09-03 NOTE — Progress Notes (Signed)
Patient discharged per orders. Prescription for pain medication given to patient. Follow up appointments, home/self/wound care discussed. When to call the doctor and/or 911 discussed. Activity restrictions discussed. Counseled patient briefly on smoking cessation; patient interrupted and stated, "As soon as I get home I'm going to smoke".  Patient refused wheelchair and attempted to walk off the unit alone. RN noticed patient leaving the unit and accompanied patient to the main entrance. Patient stated she had a ride coming and the RN didn't need to wait with her.  Asher Muir- Nomie Buchberger,RN

## 2016-09-11 ENCOUNTER — Encounter: Payer: Self-pay | Admitting: Vascular Surgery

## 2016-09-14 DIAGNOSIS — J449 Chronic obstructive pulmonary disease, unspecified: Secondary | ICD-10-CM | POA: Diagnosis not present

## 2016-09-14 DIAGNOSIS — I10 Essential (primary) hypertension: Secondary | ICD-10-CM | POA: Diagnosis not present

## 2016-09-14 DIAGNOSIS — E119 Type 2 diabetes mellitus without complications: Secondary | ICD-10-CM | POA: Diagnosis not present

## 2016-09-14 DIAGNOSIS — I6522 Occlusion and stenosis of left carotid artery: Secondary | ICD-10-CM | POA: Diagnosis not present

## 2016-09-17 ENCOUNTER — Ambulatory Visit (INDEPENDENT_AMBULATORY_CARE_PROVIDER_SITE_OTHER): Payer: Self-pay | Admitting: Vascular Surgery

## 2016-09-17 ENCOUNTER — Encounter: Payer: Self-pay | Admitting: Vascular Surgery

## 2016-09-17 VITALS — BP 140/79 | HR 112 | Temp 98.3°F | Resp 18 | Ht 68.0 in | Wt 151.8 lb

## 2016-09-17 DIAGNOSIS — I6522 Occlusion and stenosis of left carotid artery: Secondary | ICD-10-CM

## 2016-09-17 DIAGNOSIS — I739 Peripheral vascular disease, unspecified: Secondary | ICD-10-CM

## 2016-09-17 NOTE — Progress Notes (Signed)
[facility] [place] HISTORY AND PHYSICAL    History of Present Illness:  Patient is a 66 y.o. year old female who presents for post-operative follow-up after left carotid endarterectomy.  Denies headaches, numbness, tingling or other neuro deficits.  No swallowing problems.  No incisional drainage.  Unfortunately she continues to smoke. Greater than 3 minutes today spent regarding smoking cessation counseling.  She also has known right leg peripheral arterial disease and states her right leg is doing okay at this point.  Prior duplex scan showed no significant right carotid stenosis.   Physical Examination  Vitals:   09/17/16 1507  BP: (!) 144/73  Pulse: (!) 112  Resp: 18  Temp: 98.3 F (36.8 C)    Body mass index is 23.08 kg/m.  General:  Alert and oriented, no acute distress Neck: No bruit or JVD, healing left neck incision Skin: No rash Neurologic: Upper and lower extremity motor 5/5 and symmetric, no facial asymmetry  ASSESSMENT:    PLAN:  Patient will follow-up in 6 months with repeat carotid duplex exam. We will still continue to follow her peripheral arterial disease with intermittent surveillance of this unless her symptoms get worse. She was encouraged to quit smoking and to walk at least 30 minutes daily.   Fabienne Brunsharles Fields, MD [facility] [place] Office: [phone] Pager: [phone]

## 2016-09-25 NOTE — Addendum Note (Signed)
Addended by: Burton ApleyPETTY, Treston Coker A on: 09/25/2016 09:25 AM   Modules accepted: Orders

## 2016-12-04 ENCOUNTER — Encounter: Payer: Self-pay | Admitting: Family

## 2016-12-10 ENCOUNTER — Encounter: Payer: Self-pay | Admitting: Family

## 2016-12-10 ENCOUNTER — Ambulatory Visit (HOSPITAL_COMMUNITY)
Admission: RE | Admit: 2016-12-10 | Discharge: 2016-12-10 | Disposition: A | Discharge status: Home / Self Care | Payer: Medicare Other | Source: Ambulatory Visit | Attending: Family | Admitting: Family

## 2016-12-10 ENCOUNTER — Ambulatory Visit (INDEPENDENT_AMBULATORY_CARE_PROVIDER_SITE_OTHER): Payer: Medicare Other | Admitting: Family

## 2016-12-10 VITALS — BP 132/73 | HR 75 | Temp 97.1°F | Resp 18 | Ht 68.0 in | Wt 153.8 lb

## 2016-12-10 DIAGNOSIS — I70211 Atherosclerosis of native arteries of extremities with intermittent claudication, right leg: Secondary | ICD-10-CM | POA: Diagnosis not present

## 2016-12-10 DIAGNOSIS — Z9889 Other specified postprocedural states: Secondary | ICD-10-CM

## 2016-12-10 DIAGNOSIS — F17200 Nicotine dependence, unspecified, uncomplicated: Secondary | ICD-10-CM | POA: Diagnosis not present

## 2016-12-10 DIAGNOSIS — I6522 Occlusion and stenosis of left carotid artery: Secondary | ICD-10-CM

## 2016-12-10 DIAGNOSIS — R0989 Other specified symptoms and signs involving the circulatory and respiratory systems: Secondary | ICD-10-CM | POA: Insufficient documentation

## 2016-12-10 DIAGNOSIS — I739 Peripheral vascular disease, unspecified: Secondary | ICD-10-CM | POA: Diagnosis not present

## 2016-12-10 NOTE — Progress Notes (Addendum)
[facility] [place] HISTORY AND PHYSICAL   MRN : [id number]  History of Present Illness:   Andrea Horton is a 66 y.o. female who is s/p left CEA on 09-02-16 by Dr. Darrick Penna. She also has mild intermittent claudication of both calves that has been significantly alleviated by 100 mg cilostizol bid prescribed by her PCP since 2017.   The patient denies any history of TIA or stroke symptoms, specifically the patient denies a history of amaurosis fugax or monocular blindness, unilateral  facial drooping, hemiplegia, or receptive or expressive aphasia.     She is taking cilostizol since 2017, 100 mg bid prescribed by her PCP, and pt states this allows her to walk farther, states she can walk at least 20 minutes leisurely to a store that she frequents; when her legs do bother her it is bilateral calves, relieved by rest. She denies any non healing wounds in her feet or legs.    Patient had bilateral ABIs performed previouslywhich were 0.6 on the right 1.03 on the left.This was done at [facility] 04/22/2016. She had an arterial duplex exam at our office on 06-04-16 which showed greater than 75% stenosis of the mid and distal right superficial femoral artery.  Other medical problems include COPD and she is on several inhalers and nebulizer for this.   Pt Diabetic: Yes, states in control, no A1C result on file Pt smoker: smoker  (1 ppd, started in 1982)  Pt meds include: Statin :Yes Betablocker: No ASA: Yes Other anticoagulants/antiplatelets: no   Current Outpatient Prescriptions  Medication Sig Dispense Refill  . acetaminophen (TYLENOL) 500 MG tablet Take 1,000 mg by mouth every 6 (six) hours as needed (for pain).    Marland Kitchen albuterol (PROVENTIL HFA;VENTOLIN HFA) 108 (90 Base) MCG/ACT inhaler Inhale into the lungs every 6 (six) hours as needed for wheezing or shortness of breath.    Marland Kitchen amLODipine (NORVASC) 10 MG tablet Take 10 mg by mouth daily.    Marland Kitchen aspirin 81 MG  tablet Take 81 mg by mouth daily.    . B Complex-C (SUPER B COMPLEX PO) Take 1 tablet by mouth daily.    . cilostazol (PLETAL) 100 MG tablet Take 100 mg by mouth 2 (two) times daily.    . Fluticasone-Salmeterol (ADVAIR DISKUS) 100-50 MCG/DOSE AEPB Inhale 1 puff into the lungs 2 (two) times daily.    Marland Kitchen ipratropium-albuterol (DUONEB) 0.5-2.5 (3) MG/3ML SOLN Take 3 mLs by nebulization every 6 (six) hours as needed (for shortness of breath/wheezing/congestion).    . metFORMIN (GLUCOPHAGE) 500 MG tablet Take 500 mg by mouth 2 (two) times daily.  3  . oxyCODONE-acetaminophen (ROXICET) 5-325 MG tablet Take 1 tablet by mouth every 6 (six) hours as needed for severe pain. 8 tablet 0  . simvastatin (ZOCOR) 20 MG tablet Take 20 mg by mouth daily.  3   No current facility-administered medications for this visit.     Past Medical History:  Diagnosis Date  . Asthma   . Bronchitis   . COPD (chronic obstructive pulmonary disease) (HCC)   . Diabetes mellitus without complication (HCC)   . GERD (gastroesophageal reflux disease)   . History of kidney stones   . Hypertension     Social History Social History  Substance Use Topics  . Smoking status: Current Every Day Smoker    Packs/day: 1.00    Years: 35.00    Types: Cigarettes    Start date: 04/04/1981  . Smokeless tobacco: Never Used  .  Alcohol use No    Family History Family History  Problem Relation Age of Onset  . Heart disease Mother        before age 11060  . Heart disease Sister        before age 66  . Heart disease Brother        before age 66    Surgical History Past Surgical History:  Procedure Laterality Date  . ABDOMINAL HYSTERECTOMY     partial  . ENDARTERECTOMY Left 09/02/2016   Procedure: ENDARTERECTOMY CAROTID LEFT;  Surgeon: Sherren Kernsharles E Fields, MD;  [facility];  Service: Vascular;  Laterality: Left;  . PATCH ANGIOPLASTY Left 09/02/2016   Procedure: PATCH ANGIOPLASTY LEFT CAROTID ARTERY;  Surgeon: Sherren Kernsharles E Fields, MD;   [facility];  Service: Vascular;  Laterality: Left;  . TUBAL LIGATION      Allergies  Allergen Reactions  . No Known Allergies     Current Outpatient Prescriptions  Medication Sig Dispense Refill  . acetaminophen (TYLENOL) 500 MG tablet Take 1,000 mg by mouth every 6 (six) hours as needed (for pain).    Marland Kitchen. albuterol (PROVENTIL HFA;VENTOLIN HFA) 108 (90 Base) MCG/ACT inhaler Inhale into the lungs every 6 (six) hours as needed for wheezing or shortness of breath.    Marland Kitchen. amLODipine (NORVASC) 10 MG tablet Take 10 mg by mouth daily.    Marland Kitchen. aspirin 81 MG tablet Take 81 mg by mouth daily.    . B Complex-C (SUPER B COMPLEX PO) Take 1 tablet by mouth daily.    . cilostazol (PLETAL) 100 MG tablet Take 100 mg by mouth 2 (two) times daily.    . Fluticasone-Salmeterol (ADVAIR DISKUS) 100-50 MCG/DOSE AEPB Inhale 1 puff into the lungs 2 (two) times daily.    Marland Kitchen. ipratropium-albuterol (DUONEB) 0.5-2.5 (3) MG/3ML SOLN Take 3 mLs by nebulization every 6 (six) hours as needed (for shortness of breath/wheezing/congestion).    . metFORMIN (GLUCOPHAGE) 500 MG tablet Take 500 mg by mouth 2 (two) times daily.  3  . oxyCODONE-acetaminophen (ROXICET) 5-325 MG tablet Take 1 tablet by mouth every 6 (six) hours as needed for severe pain. 8 tablet 0  . simvastatin (ZOCOR) 20 MG tablet Take 20 mg by mouth daily.  3   No current facility-administered medications for this visit.      REVIEW OF SYSTEMS: See HPI for pertinent positives and negatives.  Physical Examination Vitals:   12/10/16 0831 12/10/16 0832  BP: 124/70 132/73  Pulse: 75   Resp: 18   Temp: 97.1 F (36.2 C)   TempSrc: Oral   SpO2: 98%   Weight: 153 lb 12.8 oz (69.8 kg)   Height: 5\' 8"  (1.727 m)    Body mass index is 23.39 kg/m.  General:  WDWN in NAD Gait: Normal HENT: WNL Eyes: Pupils equal Pulmonary: normal non-labored breathing, slightly limited air movement in all fields, no rales, rhonchi, or wheezing. Cardiac: RRR, low grade  aortic murmur detected  Abdomen: soft, NT, no masses palpated Skin: no rashes, no ulcers, no cellulitis.   VASCULAR EXAM  Carotid Bruits Right Left   Positive Positive        Bilateral radial pulses are 2+ palpable and =        Abdominal aortic pulse is not palpable.               VASCULAR EXAM: Extremities without ischemic changes, without Gangrene; without open wounds.  LE Pulses Right Left       FEMORAL  2+ palpable  2+ palpable        POPLITEAL  not palpable   not palpable       POSTERIOR TIBIAL  1+ palpable    1+ palpable        DORSALIS PEDIS      ANTERIOR TIBIAL 1+ palpable  1+ palpable      Musculoskeletal: no muscle wasting or atrophy; no peripheral edema          Neurologic: A&O X 3; Appropriate Affect; SENSATION: normal; MOTOR FUNCTION: 5/5 Symmetric, CN 2-12 intact, Speech is fluent/normal    ASSESSMENT:  MARONDA CAISON is a 66 y.o. female who is s/p left CEA on 09-02-16. She has no known history of stroke or TIA.  She also has mild intermittent claudication of both calves that has been significantly alleviated by 100 mg cilostizol bid prescribed by her PCP since 2017.  Her bilateral pedal pulses are 1+ palpable.   ABI's today show slight improvement in right leg arterial perfusion (moderate arterial occlusive disease), and slight decline in left leg (mild arterial occlusive disease).  Her atherosclerotic risk factors include active smoking since 1982 and apparently in control DM. She takes a daily ASA and a statin.  Her BMI is normal.   DATA ABI (Date: 12/10/16):  R:   ABI: 0.69 (0.60, 04-22-16),   PT: bi  DP: bi  TBI:  0.54  L:   ABI: 0.93 (1.03),   PT: bi  DP: tri  TBI: 0.64    PLAN:   I advised her to walk at least 30 minutes daily.  The patient was counseled re  smoking cessation and given several free resources re smoking cessation.   Based on today's exam and non-invasive vascular lab results, the patient will follow up in 3 months with the following tests: carotid duplex and ABI's; this is 6 months after her operation for left carotid endarterectomy.   I discussed in depth with the patient the nature of atherosclerosis, and emphasized the importance of maximal medical management including strict control of blood pressure, blood glucose, and lipid levels, obtaining regular exercise, and cessation of smoking.  The patient is aware that without maximal medical management the underlying atherosclerotic disease process will progress, limiting the benefit of any interventions.  The patient was given information about stroke prevention and what symptoms should prompt the patient to seek immediate medical care.  The patient was given information about PAD including signs, symptoms, treatment, what symptoms should prompt the patient to seek immediate medical care, and risk reduction measures to take. Thank you for allowing [place] to participate in this patient's care.  Charisse March, RN, MSN, FNP-C [facility] Office: [phone]  Clinic MD: [facility] 12/10/2016 8:48 AM

## 2016-12-10 NOTE — Patient Instructions (Signed)
Stroke Prevention Some medical conditions and behaviors are associated with an increased chance of having a stroke. You may prevent a stroke by making healthy choices and managing medical conditions. How can I reduce my risk of having a stroke?  Stay physically active. Get at least 30 minutes of activity on most or all days.  Do not smoke. It may also be helpful to avoid exposure to secondhand smoke.  Limit alcohol use. Moderate alcohol use is considered to be:  No more than 2 drinks per day for men.  No more than 1 drink per day for nonpregnant women.  Eat healthy foods. This involves:  Eating 5 or more servings of fruits and vegetables a day.  Making dietary changes that address high blood pressure (hypertension), high cholesterol, diabetes, or obesity.  Manage your cholesterol levels.  Making food choices that are high in fiber and low in saturated fat, trans fat, and cholesterol may control cholesterol levels.  Take any prescribed medicines to control cholesterol as directed by your health care provider.  Manage your diabetes.  Controlling your carbohydrate and sugar intake is recommended to manage diabetes.  Take any prescribed medicines to control diabetes as directed by your health care provider.  Control your hypertension.  Making food choices that are low in salt (sodium), saturated fat, trans fat, and cholesterol is recommended to manage hypertension.  Ask your health care provider if you need treatment to lower your blood pressure. Take any prescribed medicines to control hypertension as directed by your health care provider.  If you are 18-39 years of age, have your blood pressure checked every 3-5 years. If you are 40 years of age or older, have your blood pressure checked every year.  Maintain a healthy weight.  Reducing calorie intake and making food choices that are low in sodium, saturated fat, trans fat, and cholesterol are recommended to manage  weight.  Stop drug abuse.  Avoid taking birth control pills.  Talk to your health care provider about the risks of taking birth control pills if you are over 35 years old, smoke, get migraines, or have ever had a blood clot.  Get evaluated for sleep disorders (sleep apnea).  Talk to your health care provider about getting a sleep evaluation if you snore a lot or have excessive sleepiness.  Take medicines only as directed by your health care provider.  For some people, aspirin or blood thinners (anticoagulants) are helpful in reducing the risk of forming abnormal blood clots that can lead to stroke. If you have the irregular heart rhythm of atrial fibrillation, you should be on a blood thinner unless there is a good reason you cannot take them.  Understand all your medicine instructions.  Make sure that other conditions (such as anemia or atherosclerosis) are addressed. Get help right away if:  You have sudden weakness or numbness of the face, arm, or leg, especially on one side of the body.  Your face or eyelid droops to one side.  You have sudden confusion.  You have trouble speaking (aphasia) or understanding.  You have sudden trouble seeing in one or both eyes.  You have sudden trouble walking.  You have dizziness.  You have a loss of balance or coordination.  You have a sudden, severe headache with no known cause.  You have new chest pain or an irregular heartbeat. Any of these symptoms may represent a serious problem that is an emergency. Do not wait to see if the symptoms will go away.   Get medical help at once. Call your local emergency services (911 in [place] Do not drive yourself to the hospital. This information is not intended to replace advice given to you by your health care provider. Make sure you discuss any questions you have with your health care provider. Document Released: 08/13/2004 Document Revised: 12/12/2015 Document Reviewed: 01/06/2013 Elsevier  Interactive Patient Education  2017 Elsevier Inc.     Peripheral Vascular Disease Peripheral vascular disease (PVD) is a disease of the blood vessels that are not part of your heart and brain. A simple term for PVD is poor circulation. In most cases, PVD narrows the blood vessels that carry blood from your heart to the rest of your body. This can result in a decreased supply of blood to your arms, legs, and internal organs, like your stomach or kidneys. However, it most often affects a person's lower legs and feet. There are two types of PVD.  Organic PVD. This is the more common type. It is caused by damage to the structure of blood vessels.  Functional PVD. This is caused by conditions that make blood vessels contract and tighten (spasm). Without treatment, PVD tends to get worse over time. PVD can also lead to acute ischemic limb. This is when an arm or limb suddenly has trouble getting enough blood. This is a medical emergency. Follow these instructions at home:  Take medicines only as told by your doctor.  Do not use any tobacco products, including cigarettes, chewing tobacco, or electronic cigarettes. If you need help quitting, ask your doctor.  Lose weight if you are overweight, and maintain a healthy weight as told by your doctor.  Eat a diet that is low in fat and cholesterol. If you need help, ask your doctor.  Exercise regularly. Ask your doctor for some good activities for you.  Take good care of your feet.  Wear comfortable shoes that fit well.  Check your feet often for any cuts or sores. Contact a doctor if:  You have cramps in your legs while walking.  You have leg pain when you are at rest.  You have coldness in a leg or foot.  Your skin changes.  You are unable to get or have an erection (erectile dysfunction).  You have cuts or sores on your feet that are not healing. Get help right away if:  Your arm or leg turns cold and blue.  Your arms or legs  become red, warm, swollen, painful, or numb.  You have chest pain or trouble breathing.  You suddenly have weakness in your face, arm, or leg.  You become very confused or you cannot speak.  You suddenly have a very bad headache.  You suddenly cannot see. This information is not intended to replace advice given to you by your health care provider. Make sure you discuss any questions you have with your health care provider. Document Released: 09/30/2009 Document Revised: 12/12/2015 Document Reviewed: 12/14/2013 Elsevier Interactive Patient Education  2017 Elsevier Inc.       Steps to Quit Smoking Smoking tobacco can be bad for your health. It can also affect almost every organ in your body. Smoking puts you and people around you at risk for many serious long-lasting (chronic) diseases. Quitting smoking is hard, but it is one of the best things that you can do for your health. It is never too late to quit. What are the benefits of quitting smoking? When you quit smoking, you lower your risk for getting serious   diseases and conditions. They can include:  Lung cancer or lung disease.  Heart disease.  Stroke.  Heart attack.  Not being able to have children (infertility).  Weak bones (osteoporosis) and broken bones (fractures). If you have coughing, wheezing, and shortness of breath, those symptoms may get better when you quit. You may also get sick less often. If you are pregnant, quitting smoking can help to lower your chances of having a baby of low birth weight. What can I do to help me quit smoking? Talk with your doctor about what can help you quit smoking. Some things you can do (strategies) include:  Quitting smoking totally, instead of slowly cutting back how much you smoke over a period of time.  Going to in-person counseling. You are more likely to quit if you go to many counseling sessions.  Using resources and support systems, such as:  Online chats with a  counselor.  Phone quitlines.  Printed self-help materials.  Support groups or group counseling.  Text messaging programs.  Mobile phone apps or applications.  Taking medicines. Some of these medicines may have nicotine in them. If you are pregnant or breastfeeding, do not take any medicines to quit smoking unless your doctor says it is okay. Talk with your doctor about counseling or other things that can help you. Talk with your doctor about using more than one strategy at the same time, such as taking medicines while you are also going to in-person counseling. This can help make quitting easier. What things can I do to make it easier to quit? Quitting smoking might feel very hard at first, but there is a lot that you can do to make it easier. Take these steps:  Talk to your family and friends. Ask them to support and encourage you.  Call phone quitlines, reach out to support groups, or work with a counselor.  Ask people who smoke to not smoke around you.  Avoid places that make you want (trigger) to smoke, such as:  Bars.  Parties.  Smoke-break areas at work.  Spend time with people who do not smoke.  Lower the stress in your life. Stress can make you want to smoke. Try these things to help your stress:  Getting regular exercise.  Deep-breathing exercises.  Yoga.  Meditating.  Doing a body scan. To do this, close your eyes, focus on one area of your body at a time from head to toe, and notice which parts of your body are tense. Try to relax the muscles in those areas.  Download or buy apps on your mobile phone or tablet that can help you stick to your quit plan. There are many free apps, such as QuitGuide from the CDC (Centers for Disease Control and Prevention). You can find more support from smokefree.gov and other websites. This information is not intended to replace advice given to you by your health care provider. Make sure you discuss any questions you have with  your health care provider. Document Released: 05/02/2009 Document Revised: 03/03/2016 Document Reviewed: 11/20/2014 Elsevier Interactive Patient Education  2017 Elsevier Inc.  

## 2016-12-11 NOTE — Addendum Note (Signed)
Addended by: Burton ApleyPETTY, Everlena Mackley A on: 12/11/2016 09:38 AM   Modules accepted: Orders

## 2016-12-22 DIAGNOSIS — F17200 Nicotine dependence, unspecified, uncomplicated: Secondary | ICD-10-CM | POA: Diagnosis not present

## 2016-12-22 DIAGNOSIS — E785 Hyperlipidemia, unspecified: Secondary | ICD-10-CM | POA: Diagnosis not present

## 2016-12-22 DIAGNOSIS — J449 Chronic obstructive pulmonary disease, unspecified: Secondary | ICD-10-CM | POA: Diagnosis not present

## 2016-12-22 DIAGNOSIS — I10 Essential (primary) hypertension: Secondary | ICD-10-CM | POA: Diagnosis not present

## 2017-01-18 DIAGNOSIS — I10 Essential (primary) hypertension: Secondary | ICD-10-CM | POA: Diagnosis not present

## 2017-01-18 DIAGNOSIS — I739 Peripheral vascular disease, unspecified: Secondary | ICD-10-CM | POA: Diagnosis not present

## 2017-01-18 DIAGNOSIS — Z79899 Other long term (current) drug therapy: Secondary | ICD-10-CM | POA: Diagnosis not present

## 2017-01-18 DIAGNOSIS — Z7984 Long term (current) use of oral hypoglycemic drugs: Secondary | ICD-10-CM | POA: Diagnosis not present

## 2017-01-18 DIAGNOSIS — L309 Dermatitis, unspecified: Secondary | ICD-10-CM | POA: Diagnosis not present

## 2017-01-18 DIAGNOSIS — E7800 Pure hypercholesterolemia, unspecified: Secondary | ICD-10-CM | POA: Diagnosis not present

## 2017-01-18 DIAGNOSIS — F17200 Nicotine dependence, unspecified, uncomplicated: Secondary | ICD-10-CM | POA: Diagnosis not present

## 2017-01-18 DIAGNOSIS — J449 Chronic obstructive pulmonary disease, unspecified: Secondary | ICD-10-CM | POA: Diagnosis not present

## 2017-03-19 ENCOUNTER — Ambulatory Visit: Payer: Medicare Other | Admitting: Family

## 2017-03-19 ENCOUNTER — Encounter (HOSPITAL_COMMUNITY): Payer: Medicare Other

## 2017-03-23 DIAGNOSIS — E119 Type 2 diabetes mellitus without complications: Secondary | ICD-10-CM | POA: Diagnosis not present

## 2017-03-23 DIAGNOSIS — J449 Chronic obstructive pulmonary disease, unspecified: Secondary | ICD-10-CM | POA: Diagnosis not present

## 2017-03-23 DIAGNOSIS — E785 Hyperlipidemia, unspecified: Secondary | ICD-10-CM | POA: Diagnosis not present

## 2017-03-23 DIAGNOSIS — F17200 Nicotine dependence, unspecified, uncomplicated: Secondary | ICD-10-CM | POA: Diagnosis not present

## 2017-03-23 DIAGNOSIS — F17210 Nicotine dependence, cigarettes, uncomplicated: Secondary | ICD-10-CM | POA: Diagnosis not present

## 2017-03-25 ENCOUNTER — Ambulatory Visit: Payer: Medicare Other | Admitting: Family

## 2017-03-25 ENCOUNTER — Encounter (HOSPITAL_COMMUNITY): Payer: Medicare Other

## 2017-04-14 ENCOUNTER — Encounter (HOSPITAL_COMMUNITY): Payer: Medicare Other

## 2017-04-14 ENCOUNTER — Ambulatory Visit: Payer: Medicare Other | Admitting: Family

## 2017-05-17 ENCOUNTER — Ambulatory Visit (INDEPENDENT_AMBULATORY_CARE_PROVIDER_SITE_OTHER)
Admission: RE | Admit: 2017-05-17 | Discharge: 2017-05-17 | Disposition: A | Discharge status: Home / Self Care | Payer: Medicare Other | Source: Ambulatory Visit | Attending: Family | Admitting: Family

## 2017-05-17 ENCOUNTER — Encounter: Payer: Self-pay | Admitting: Family

## 2017-05-17 ENCOUNTER — Ambulatory Visit (INDEPENDENT_AMBULATORY_CARE_PROVIDER_SITE_OTHER): Payer: Medicare Other | Admitting: Family

## 2017-05-17 ENCOUNTER — Ambulatory Visit (HOSPITAL_COMMUNITY)
Admission: RE | Admit: 2017-05-17 | Discharge: 2017-05-17 | Disposition: A | Discharge status: Home / Self Care | Payer: Medicare Other | Source: Ambulatory Visit | Attending: Family | Admitting: Family

## 2017-05-17 VITALS — BP 154/91 | HR 88 | Temp 97.0°F | Resp 16 | Ht 68.0 in | Wt 152.0 lb

## 2017-05-17 DIAGNOSIS — F17200 Nicotine dependence, unspecified, uncomplicated: Secondary | ICD-10-CM | POA: Diagnosis not present

## 2017-05-17 DIAGNOSIS — I70211 Atherosclerosis of native arteries of extremities with intermittent claudication, right leg: Secondary | ICD-10-CM

## 2017-05-17 DIAGNOSIS — E1151 Type 2 diabetes mellitus with diabetic peripheral angiopathy without gangrene: Secondary | ICD-10-CM | POA: Diagnosis not present

## 2017-05-17 DIAGNOSIS — I6522 Occlusion and stenosis of left carotid artery: Secondary | ICD-10-CM

## 2017-05-17 DIAGNOSIS — I6523 Occlusion and stenosis of bilateral carotid arteries: Secondary | ICD-10-CM | POA: Diagnosis not present

## 2017-05-17 DIAGNOSIS — I10 Essential (primary) hypertension: Secondary | ICD-10-CM | POA: Insufficient documentation

## 2017-05-17 DIAGNOSIS — Z9889 Other specified postprocedural states: Secondary | ICD-10-CM

## 2017-05-17 DIAGNOSIS — E785 Hyperlipidemia, unspecified: Secondary | ICD-10-CM | POA: Insufficient documentation

## 2017-05-17 LAB — VAS US CAROTID
LCCADSYS: 52 cm/s
LCCAPDIAS: 21 cm/s
LEFT ECA DIAS: -16 cm/s
Left CCA dist dias: 19 cm/s
Left CCA prox sys: 94 cm/s
Left ICA dist dias: -28 cm/s
Left ICA dist sys: -77 cm/s
Left ICA prox dias: -52 cm/s
Left ICA prox sys: -182 cm/s
RCCADSYS: -75 cm/s
RCCAPDIAS: -21 cm/s
RIGHT CCA MID DIAS: 26 cm/s
RIGHT ECA DIAS: -18 cm/s
Right CCA prox sys: -103 cm/s

## 2017-05-17 NOTE — Progress Notes (Signed)
[facility] [place] HISTORY AND PHYSICAL   CC: Follow up extracranial carotid artery stenosis and intermittent claudication    History of Present Illness:   Andrea Horton is a 66 y.o. female who is s/p left CEA on 09-02-16 by Dr. Darrick Penna. She also has mild intermittent claudication of both calves that has been significantly alleviated by 100 mg cilostizol bid prescribed by her PCP since 2017.   The patient denies any history of TIA or stroke symptoms, specifically the patient denies a history of amaurosis fugax or monocular blindness, unilateral  facial drooping, hemiplegia, or receptive or expressive aphasia.    She is taking cilostizol since 2017, 100 mg bid prescribed by her PCP, and pt states this allows her to walk farther, states she can walk at least 20 minutes leisurely to a store that she frequents; when her legs do bother her it is bilateral calves, relieved by rest. She denies any non healing wounds in her feet or legs.    Patient had bilateral ABIs performed previouslywhich were 0.6 on the right 1.03 on the left.[facility]. She had an arterial duplex exam at our office on 11-16-17which showed greater than 75% stenosis of the mid and distal right superficial femoral artery. She has right calf claudication after she returns walking from a nearby store, denies claudication+ sx's in left leg. .   Other medical problems include COPD and she is on several inhalers and nebulizer for this.   She reports dizziness upon standing which resolves in a short period of time; she also admits to not drinking enough water.   Pt Diabetic: Yes, states in control, no A1C result on file Pt smoker: former smoker (1ppd, started in 1982); states her last cigarette was 2 days ago.   Pt meds include: Statin :Yes Betablocker: No ASA: Yes Other anticoagulants/antiplatelets: no   Current Outpatient Prescriptions  Medication Sig  Dispense Refill  . acetaminophen (TYLENOL) 500 MG tablet Take 1,000 mg by mouth every 6 (six) hours as needed (for pain).    Marland Kitchen albuterol (PROVENTIL HFA;VENTOLIN HFA) 108 (90 Base) MCG/ACT inhaler Inhale into the lungs every 6 (six) hours as needed for wheezing or shortness of breath.    Marland Kitchen amLODipine (NORVASC) 10 MG tablet Take 10 mg by mouth daily.    Marland Kitchen aspirin 81 MG tablet Take 81 mg by mouth daily.    . B Complex-C (SUPER B COMPLEX PO) Take 1 tablet by mouth daily.    . cilostazol (PLETAL) 100 MG tablet Take 100 mg by mouth 2 (two) times daily.    . Fluticasone-Salmeterol (ADVAIR DISKUS) 100-50 MCG/DOSE AEPB Inhale 1 puff into the lungs 2 (two) times daily.    Marland Kitchen ipratropium-albuterol (DUONEB) 0.5-2.5 (3) MG/3ML SOLN Take 3 mLs by nebulization every 6 (six) hours as needed (for shortness of breath/wheezing/congestion).    . metFORMIN (GLUCOPHAGE) 500 MG tablet Take 500 mg by mouth 2 (two) times daily.  3  . oxyCODONE-acetaminophen (ROXICET) 5-325 MG tablet Take 1 tablet by mouth every 6 (six) hours as needed for severe pain. 8 tablet 0  . simvastatin (ZOCOR) 20 MG tablet Take 20 mg by mouth daily.  3   No current facility-administered medications for this visit.     Past Medical History:  Diagnosis Date  . Asthma   . Bronchitis   . COPD (chronic obstructive pulmonary disease) (HCC)   . Diabetes mellitus without complication (HCC)   . GERD (gastroesophageal reflux disease)   .  History of kidney stones   . Hypertension     Social History Social History  Substance Use Topics  . Smoking status: Current Every Day Smoker    Packs/day: 1.00    Years: 35.00    Types: Cigarettes    Start date: 04/04/1981  . Smokeless tobacco: Never Used  . Alcohol use No    Family History Family History  Problem Relation Age of Onset  . Heart disease Mother        before age 66  . Heart disease Sister        before age 66  . Heart disease Brother        before age 66    Surgical History Past  Surgical History:  Procedure Laterality Date  . ABDOMINAL HYSTERECTOMY     partial  . ENDARTERECTOMY Left 09/02/2016   Procedure: ENDARTERECTOMY CAROTID LEFT;  Surgeon: Sherren Kernsharles E Fields, MD;  [facility];  Service: Vascular;  Laterality: Left;  . PATCH ANGIOPLASTY Left 09/02/2016   Procedure: PATCH ANGIOPLASTY LEFT CAROTID ARTERY;  Surgeon: Sherren Kernsharles E Fields, MD;  [facility];  Service: Vascular;  Laterality: Left;  . TUBAL LIGATION      Allergies  Allergen Reactions  . No Known Allergies     Current Outpatient Prescriptions  Medication Sig Dispense Refill  . acetaminophen (TYLENOL) 500 MG tablet Take 1,000 mg by mouth every 6 (six) hours as needed (for pain).    Marland Kitchen. albuterol (PROVENTIL HFA;VENTOLIN HFA) 108 (90 Base) MCG/ACT inhaler Inhale into the lungs every 6 (six) hours as needed for wheezing or shortness of breath.    Marland Kitchen. amLODipine (NORVASC) 10 MG tablet Take 10 mg by mouth daily.    Marland Kitchen. aspirin 81 MG tablet Take 81 mg by mouth daily.    . B Complex-C (SUPER B COMPLEX PO) Take 1 tablet by mouth daily.    . cilostazol (PLETAL) 100 MG tablet Take 100 mg by mouth 2 (two) times daily.    . Fluticasone-Salmeterol (ADVAIR DISKUS) 100-50 MCG/DOSE AEPB Inhale 1 puff into the lungs 2 (two) times daily.    Marland Kitchen. ipratropium-albuterol (DUONEB) 0.5-2.5 (3) MG/3ML SOLN Take 3 mLs by nebulization every 6 (six) hours as needed (for shortness of breath/wheezing/congestion).    . metFORMIN (GLUCOPHAGE) 500 MG tablet Take 500 mg by mouth 2 (two) times daily.  3  . oxyCODONE-acetaminophen (ROXICET) 5-325 MG tablet Take 1 tablet by mouth every 6 (six) hours as needed for severe pain. 8 tablet 0  . simvastatin (ZOCOR) 20 MG tablet Take 20 mg by mouth daily.  3   No current facility-administered medications for this visit.      REVIEW OF SYSTEMS: See HPI for pertinent positives and negatives.  Physical Examination Vitals:   05/17/17 0913 05/17/17 0914 05/17/17 0915 05/17/17 0916  BP: (!) 152/81  (!) 145/87 (!) 148/82 (!) 154/91  Pulse: 88     Resp: 16     Temp: (!) 97 F (36.1 C)     SpO2: 100%     Weight: 152 lb (68.9 kg)     Height: 5\' 8"  (1.727 m)      Body mass index is 23.11 kg/m.  General:  WDWN in NAD Gait: Normal HENT: WNL Eyes: PERRLA Pulmonary: normal non-labored breathing, slightly limitedair movement in all fields, no rales, rhonchi, orwheezing. Cardiac: RRR, no murmur detected  Abdomen: soft, NT, no masses palpated Skin: no rashes, no ulcers, no cellulitis.   VASCULAR EXAM  Carotid Bruits Right Left  negative negative   Bilateral radial pulses are 2+ palpable and =  Abdominal aortic pulse is not palpable.  VASCULAR EXAM: Extremitieswithoutischemic changes, withoutGangrene; withoutopen wounds.  LE Pulses Right Left  FEMORAL 2+palpable 2+palpable   POPLITEAL notpalpable  notpalpable  POSTERIOR TIBIAL 1+palpable   1+palpable   DORSALIS PEDIS ANTERIOR TIBIAL 1+palpable  1+palpable     Musculoskeletal: no muscle wasting or atrophy; no peripheral edema Neurologic:A&O X 3; Appropriate Affect; SENSATION: normal; MOTOR FUNCTION: 5/5 Symmetric, CN 2-12 intact, Speech is fluent/normal     ASSESSMENT:  Andrea Horton is a 66 y.o. female who is s/p left CEA on 09-02-16. She has no known history of stroke or TIA.  She also has mild intermittent claudication of both calves that has been significantly alleviated by 100 mg cilostizol bid prescribed by her PCP since 2017.  Her bilateral pedal pulses are 1+ palpable.   Her atherosclerotic risk factors include active smoking since 1982 (states her last cigarette was 2 days ago) and apparently in control DM. She takes a daily ASA and a statin.  Her BMI is normal.    I discussed with Dr. Myra Gianotti the increased stenosis of the left CEA site, pt remains asymptomatic, see Plan.   DATA   Carotid Duplex  (05/17/17): Right ICA: 40-59% stenosis (high end of range).  Left ICA: CEA site with 60-79% stenosis; moderate plaque at the origin of the ICA. Bilateral vertebral artery flow is antegrade.  Bilateral subclavian artery waveforms are normal.  No prior post intervention exam available for comparison.   ABI (Date: 05/17/2017):  R:   ABI: 0.79 (was 0.69 on 12-10-16),   PT: bi  DP: mono  TBI:  0.55  L:   ABI: 1.09 (was 0.93),   PT: tri  DP: tri  TBI: 0.69     PLAN:   I advised her to walk at least 30 minutes daily.  The patient was counseled re smoking cessation and given several free resources re smoking cessation.  Based on today's exam and non-invasive vascular lab results, and after discussing with Dr. Myra Gianotti, the patient will follow up in 6 months with the following tests: carotid duplex and ABI's.  I discussed in depth with the patient the nature of atherosclerosis, and emphasized the importance of maximal medical management including strict control of blood pressure, blood glucose, and lipid levels, obtaining regular exercise, and cessation of smoking.  The patient is aware that without maximal medical management the underlying atherosclerotic disease process will progress, limiting the benefit of any interventions.  The patient was given information about stroke prevention and what symptoms should prompt the patient to seek immediate medical care.  The patient was given information about PAD including signs, symptoms, treatment, what symptoms should prompt the patient to seek immediate medical care, and risk reduction measures to take.  Thank you for allowing [place] to participate in this patient's care.  Charisse March, RN, MSN, FNP-C [facility] Office: [phone]  Clinic MD: Myra Gianotti 05/17/2017 9:20 AM

## 2017-05-17 NOTE — Patient Instructions (Addendum)
Stroke Prevention Some medical conditions and behaviors are associated with an increased chance of having a stroke. You may prevent a stroke by making healthy choices and managing medical conditions. How can I reduce my risk of having a stroke?  Stay physically active. Get at least 30 minutes of activity on most or all days.  Do not smoke. It may also be helpful to avoid exposure to secondhand smoke.  Limit alcohol use. Moderate alcohol use is considered to be: ? No more than 2 drinks per day for men. ? No more than 1 drink per day for nonpregnant women.  Eat healthy foods. This involves: ? Eating 5 or more servings of fruits and vegetables a day. ? Making dietary changes that address high blood pressure (hypertension), high cholesterol, diabetes, or obesity.  Manage your cholesterol levels. ? Making food choices that are high in fiber and low in saturated fat, trans fat, and cholesterol may control cholesterol levels. ? Take any prescribed medicines to control cholesterol as directed by your health care provider.  Manage your diabetes. ? Controlling your carbohydrate and sugar intake is recommended to manage diabetes. ? Take any prescribed medicines to control diabetes as directed by your health care provider.  Control your hypertension. ? Making food choices that are low in salt (sodium), saturated fat, trans fat, and cholesterol is recommended to manage hypertension. ? Ask your health care provider if you need treatment to lower your blood pressure. Take any prescribed medicines to control hypertension as directed by your health care provider. ? If you are 49-43 years of age, have your blood pressure checked every 3-5 years. If you are 51 years of age or older, have your blood pressure checked every year.  Maintain a healthy weight. ? Reducing calorie intake and making food choices that are low in sodium, saturated fat, trans fat, and cholesterol are recommended to manage  weight.  Stop drug abuse.  Avoid taking birth control pills. ? Talk to your health care provider about the risks of taking birth control pills if you are over 38 years old, smoke, get migraines, or have ever had a blood clot.  Get evaluated for sleep disorders (sleep apnea). ? Talk to your health care provider about getting a sleep evaluation if you snore a lot or have excessive sleepiness.  Take medicines only as directed by your health care provider. ? For some people, aspirin or blood thinners (anticoagulants) are helpful in reducing the risk of forming abnormal blood clots that can lead to stroke. If you have the irregular heart rhythm of atrial fibrillation, you should be on a blood thinner unless there is a good reason you cannot take them. ? Understand all your medicine instructions.  Make sure that other conditions (such as anemia or atherosclerosis) are addressed. Get help right away if:  You have sudden weakness or numbness of the face, arm, or leg, especially on one side of the body.  Your face or eyelid droops to one side.  You have sudden confusion.  You have trouble speaking (aphasia) or understanding.  You have sudden trouble seeing in one or both eyes.  You have sudden trouble walking.  You have dizziness.  You have a loss of balance or coordination.  You have a sudden, severe headache with no known cause.  You have new chest pain or an irregular heartbeat. Any of these symptoms may represent a serious problem that is an emergency. Do not wait to see if the symptoms will go away.  Get medical help at once. Call your local emergency services (911 in [place] Do not drive yourself to the hospital. This information is not intended to replace advice given to you by your health care provider. Make sure you discuss any questions you have with your health care provider. Document Released: 08/13/2004 Document Revised: 12/12/2015 Document Reviewed: 01/06/2013 Elsevier  Interactive Patient Education  2017 Elsevier Inc.     Intermittent Claudication Intermittent claudication is pain in your leg that occurs when you walk or exercise and goes away when you rest. The pain can occur in one or both legs. What are the causes? Intermittent claudication is caused by the buildup of plaque within the major arteries in the body (atherosclerosis). The plaque, which makes arteries stiff and narrow, prevents enough blood from reaching your leg muscles. The pain occurs when you walk or exercise because your muscles need more blood when you are moving and exercising. What increases the risk? Risk factors include:  A family history of atherosclerosis.  A personal history of stroke or heart disease.  Older age.  Being inactive or overweight.  Smoking cigarettes.  Having another health condition such as: ? Diabetes. ? High blood pressure. ? High cholesterol.  What are the signs or symptoms? Your hip or leg may:  Ache.  Cramp.  Feel tight.  Feel weak.  Feel heavy.  Over time, you may feel pain in your calf, thigh, or hip. How is this diagnosed? Your health care provider may diagnose intermittent claudication based on your symptoms and medical history. Your health care provider may also do tests to learn more about your condition. These may include:  Blood tests.  An ultrasound.  Imaging tests such as angiography, magnetic resonance angiography (MRA), and computed tomography angiography (CTA).  How is this treated? You may be treated for problems such as:  High blood pressure.  High cholesterol.  Diabetes.  Other treatments may include:  Lifestyle changes such as: ? Starting an exercise program. ? Losing weight. ? Quitting smoking.  Medicines to help restore blood flow through your legs.  Blood vessel surgery (angioplasty) to restore blood flow if your intermittent claudication is caused by severe peripheral artery disease.  Follow  these instructions at home:  Manage any other health conditions you have.  Eat a diet low in saturated fats and calories to maintain a healthy weight.  Quit smoking, if you smoke.  Take medicines only as directed by your health care provider.  If your health care provider recommended an exercise program for you, follow it as directed. Your exercise program may involve: ? Walking three or more times a week. ? Walking until you have certain symptoms of intermittent claudication. ? Resting until symptoms go away. ? Gradually increasing walking time to about 50 minutes a day. Contact a health care provider if: Your condition is not getting better or is getting worse. Get help right away if:  You have chest pain.  You have difficulty breathing.  You develop arm weakness.  You have trouble speaking.  Your face begins to droop. This information is not intended to replace advice given to you by your health care provider. Make sure you discuss any questions you have with your health care provider. Document Released: 05/08/2004 Document Revised: 12/12/2015 Document Reviewed: 10/12/2013 Elsevier Interactive Patient Education  2017 [employer]     Steps to Quit Smoking Smoking tobacco can be bad for your health. It can also affect almost every organ in your body. Smoking puts  you and people around you at risk for many serious long-lasting (chronic) diseases. Quitting smoking is hard, but it is one of the best things that you can do for your health. It is never too late to quit. What are the benefits of quitting smoking? When you quit smoking, you lower your risk for getting serious diseases and conditions. They can include:  Lung cancer or lung disease.  Heart disease.  Stroke.  Heart attack.  Not being able to have children (infertility).  Weak bones (osteoporosis) and broken bones (fractures).  If you have coughing, wheezing, and shortness of breath, those symptoms may  get better when you quit. You may also get sick less often. If you are pregnant, quitting smoking can help to lower your chances of having a baby of low birth weight. What can I do to help me quit smoking? Talk with your doctor about what can help you quit smoking. Some things you can do (strategies) include:  Quitting smoking totally, instead of slowly cutting back how much you smoke over a period of time.  Going to in-person counseling. You are more likely to quit if you go to many counseling sessions.  Using resources and support systems, such as: ? Agricultural engineer with a Veterinary surgeon. ? Phone quitlines. ? Automotive engineer. ? Support groups or group counseling. ? Text messaging programs. ? Mobile phone apps or applications.  Taking medicines. Some of these medicines may have nicotine in them. If you are pregnant or breastfeeding, do not take any medicines to quit smoking unless your doctor says it is okay. Talk with your doctor about counseling or other things that can help you.  Talk with your doctor about using more than one strategy at the same time, such as taking medicines while you are also going to in-person counseling. This can help make quitting easier. What things can I do to make it easier to quit? Quitting smoking might feel very hard at first, but there is a lot that you can do to make it easier. Take these steps:  Talk to your family and friends. Ask them to support and encourage you.  Call phone quitlines, reach out to support groups, or work with a Veterinary surgeon.  Ask people who smoke to not smoke around you.  Avoid places that make you want (trigger) to smoke, such as: ? Bars. ? Parties. ? Smoke-break areas at work.  Spend time with people who do not smoke.  Lower the stress in your life. Stress can make you want to smoke. Try these things to help your stress: ? Getting regular exercise. ? Deep-breathing exercises. ? Yoga. ? Meditating. ? Doing a body  scan. To do this, close your eyes, focus on one area of your body at a time from head to toe, and notice which parts of your body are tense. Try to relax the muscles in those areas.  Download or buy apps on your mobile phone or tablet that can help you stick to your quit plan. There are many free apps, such as QuitGuide from the [employer] developer for Disease Control and Prevention). You can find more support from smokefree.gov and other websites.  This information is not intended to replace advice given to you by your health care provider. Make sure you discuss any questions you have with your health care provider. Document Released: 05/02/2009 Document Revised: 03/03/2016 Document Reviewed: 11/20/2014 Elsevier Interactive Patient Education  2018 [employer].

## 2017-05-27 NOTE — Addendum Note (Signed)
Addended by: Burton ApleyPETTY, Pedrohenrique Mcconville A on: 05/27/2017 12:28 PM   Modules accepted: Orders

## 2017-06-21 DIAGNOSIS — F17210 Nicotine dependence, cigarettes, uncomplicated: Secondary | ICD-10-CM | POA: Diagnosis not present

## 2017-06-21 DIAGNOSIS — Z0000 Encounter for general adult medical examination without abnormal findings: Secondary | ICD-10-CM | POA: Diagnosis not present

## 2017-06-21 DIAGNOSIS — E119 Type 2 diabetes mellitus without complications: Secondary | ICD-10-CM | POA: Diagnosis not present

## 2017-06-21 DIAGNOSIS — R739 Hyperglycemia, unspecified: Secondary | ICD-10-CM | POA: Diagnosis not present

## 2017-06-21 DIAGNOSIS — E785 Hyperlipidemia, unspecified: Secondary | ICD-10-CM | POA: Diagnosis not present

## 2017-06-21 DIAGNOSIS — J410 Simple chronic bronchitis: Secondary | ICD-10-CM | POA: Diagnosis not present

## 2017-06-21 DIAGNOSIS — Z23 Encounter for immunization: Secondary | ICD-10-CM | POA: Diagnosis not present

## 2017-06-21 DIAGNOSIS — I158 Other secondary hypertension: Secondary | ICD-10-CM | POA: Diagnosis not present

## 2017-06-21 DIAGNOSIS — Z131 Encounter for screening for diabetes mellitus: Secondary | ICD-10-CM | POA: Diagnosis not present

## 2017-06-21 DIAGNOSIS — F17200 Nicotine dependence, unspecified, uncomplicated: Secondary | ICD-10-CM | POA: Diagnosis not present

## 2017-06-21 DIAGNOSIS — J449 Chronic obstructive pulmonary disease, unspecified: Secondary | ICD-10-CM | POA: Diagnosis not present

## 2017-06-21 DIAGNOSIS — I10 Essential (primary) hypertension: Secondary | ICD-10-CM | POA: Diagnosis not present

## 2017-06-21 DIAGNOSIS — Z1389 Encounter for screening for other disorder: Secondary | ICD-10-CM | POA: Diagnosis not present

## 2017-10-22 DIAGNOSIS — J410 Simple chronic bronchitis: Secondary | ICD-10-CM | POA: Diagnosis not present

## 2017-10-22 DIAGNOSIS — E785 Hyperlipidemia, unspecified: Secondary | ICD-10-CM | POA: Diagnosis not present

## 2017-10-22 DIAGNOSIS — E119 Type 2 diabetes mellitus without complications: Secondary | ICD-10-CM | POA: Diagnosis not present

## 2017-10-22 DIAGNOSIS — I10 Essential (primary) hypertension: Secondary | ICD-10-CM | POA: Diagnosis not present

## 2017-11-15 ENCOUNTER — Ambulatory Visit (HOSPITAL_COMMUNITY)
Admission: RE | Admit: 2017-11-15 | Discharge: 2017-11-15 | Disposition: A | Discharge status: Home / Self Care | Payer: Medicare Other | Source: Ambulatory Visit | Attending: Family | Admitting: Family

## 2017-11-15 ENCOUNTER — Ambulatory Visit (INDEPENDENT_AMBULATORY_CARE_PROVIDER_SITE_OTHER): Payer: Medicare Other | Admitting: Family

## 2017-11-15 ENCOUNTER — Encounter: Payer: Self-pay | Admitting: Family

## 2017-11-15 ENCOUNTER — Ambulatory Visit (INDEPENDENT_AMBULATORY_CARE_PROVIDER_SITE_OTHER)
Admission: RE | Admit: 2017-11-15 | Discharge: 2017-11-15 | Disposition: A | Discharge status: Home / Self Care | Payer: Medicare Other | Source: Ambulatory Visit | Attending: Family | Admitting: Family

## 2017-11-15 VITALS — BP 120/79 | HR 70 | Temp 98.0°F | Resp 16 | Ht 68.0 in | Wt 149.6 lb

## 2017-11-15 DIAGNOSIS — I6522 Occlusion and stenosis of left carotid artery: Secondary | ICD-10-CM | POA: Diagnosis not present

## 2017-11-15 DIAGNOSIS — I70211 Atherosclerosis of native arteries of extremities with intermittent claudication, right leg: Secondary | ICD-10-CM | POA: Diagnosis not present

## 2017-11-15 DIAGNOSIS — F17200 Nicotine dependence, unspecified, uncomplicated: Secondary | ICD-10-CM | POA: Diagnosis not present

## 2017-11-15 DIAGNOSIS — Z9889 Other specified postprocedural states: Secondary | ICD-10-CM

## 2017-11-15 NOTE — Progress Notes (Signed)
[facility] [place] HISTORY AND PHYSICAL   CC: Follow up extracranial carotid artery stenosis and intermittent claudication    History of Present Illness:   Andrea Horton is a 67 y.o. female who is s/p left CEA on 09-02-16 by Dr. Darrick Penna. She also has mild intermittent claudication of both calves that has been significantly alleviated by 100 mg cilostizol bid prescribed by her PCP since 2017.   The patient denies any history of TIA or stroke symptoms, specifically the patient denies a history of amaurosis fugax or monocular blindness, unilateral facial drooping, hemiplegia, orreceptive or expressive aphasia.   She is taking cilostizol since 2017, 100 mg bid prescribed by her PCP, and pt states this allows her to walk farther, states she can walk at least 20 minutes leisurely to a store that she frequents; when her legs do bother her it is bilateral calves, relieved by rest. She denies any non healing wounds in her feet or legs.   Patient had bilateral ABIs performed previouslywhich were 0.6 on the right 1.03 on the left.[facility]. She had an arterial duplex exam at our office on 11-16-17which showed greater than 75% stenosis of the mid and distal right superficial femoral artery. She has right calf claudication after she returns walking from a nearby store, denies claudication+ sx's in left leg. .   Other medical problems include COPD and she is on several inhalers and nebulizer for this.   She reports dizziness upon standing which resolves in a short period of time; she also admits to not drinking enough water.   Diabetic: Yes, states in control, no A1C result on file Tobacco use: current smoker (1/2ppd, started in 1982)  Pt meds include: Statin :Yes Betablocker: No ASA: Yes Other anticoagulants/antiplatelets: no    Current Outpatient Medications  Medication Sig Dispense Refill  . acetaminophen (TYLENOL)  500 MG tablet Take 1,000 mg by mouth every 6 (six) hours as needed (for pain).    Marland Kitchen albuterol (PROVENTIL HFA;VENTOLIN HFA) 108 (90 Base) MCG/ACT inhaler Inhale into the lungs every 6 (six) hours as needed for wheezing or shortness of breath.    Marland Kitchen amLODipine (NORVASC) 5 MG tablet   3  . aspirin 81 MG tablet Take 81 mg by mouth daily.    . B Complex-C (SUPER B COMPLEX PO) Take 1 tablet by mouth daily.    . cilostazol (PLETAL) 100 MG tablet Take 100 mg by mouth 2 (two) times daily.    . Fluticasone-Salmeterol (ADVAIR DISKUS) 100-50 MCG/DOSE AEPB Inhale 1 puff into the lungs 2 (two) times daily.    Marland Kitchen HYDROcodone-acetaminophen (NORCO/VICODIN) 5-325 MG tablet   0  . ibuprofen (ADVIL,MOTRIN) 800 MG tablet   0  . ipratropium-albuterol (DUONEB) 0.5-2.5 (3) MG/3ML SOLN Take 3 mLs by nebulization every 6 (six) hours as needed (for shortness of breath/wheezing/congestion).    . metFORMIN (GLUCOPHAGE) 500 MG tablet Take 500 mg by mouth 2 (two) times daily.  3  . oxyCODONE-acetaminophen (ROXICET) 5-325 MG tablet Take 1 tablet by mouth every 6 (six) hours as needed for severe pain. 8 tablet 0  . simvastatin (ZOCOR) 20 MG tablet Take 20 mg by mouth daily.  3   No current facility-administered medications for this visit.     Past Medical History:  Diagnosis Date  . Asthma   . Bronchitis   . COPD (chronic obstructive pulmonary disease) (HCC)   . Diabetes mellitus without complication (HCC)   . GERD (gastroesophageal  reflux disease)   . History of kidney stones   . Hypertension     Social History Social History   Tobacco Use  . Smoking status: Current Every Day Smoker    Packs/day: 1.00    Years: 35.00    Pack years: 35.00    Types: Cigarettes    Start date: 04/04/1981  . Smokeless tobacco: Never Used  Substance Use Topics  . Alcohol use: No  . Drug use: Yes    Types: Marijuana    Comment: smoked Crack 20 yers ago, will smoke marijuana every once in a while    Family History Family History   Problem Relation Age of Onset  . Heart disease Mother        before age 50  . Heart disease Sister        before age 75  . Heart disease Brother        before age 50    Surgical History Past Surgical History:  Procedure Laterality Date  . ABDOMINAL HYSTERECTOMY     partial  . ENDARTERECTOMY Left 09/02/2016   Procedure: ENDARTERECTOMY CAROTID LEFT;  Surgeon: Sherren Kerns, MD;  Location: [facility] OR;  Service: Vascular;  Laterality: Left;  . PATCH ANGIOPLASTY Left 09/02/2016   Procedure: PATCH ANGIOPLASTY LEFT CAROTID ARTERY;  Surgeon: Sherren Kerns, MD;  Location: [facility] OR;  Service: Vascular;  Laterality: Left;  . TUBAL LIGATION      Allergies  Allergen Reactions  . No Known Allergies     Current Outpatient Medications  Medication Sig Dispense Refill  . acetaminophen (TYLENOL) 500 MG tablet Take 1,000 mg by mouth every 6 (six) hours as needed (for pain).    Marland Kitchen albuterol (PROVENTIL HFA;VENTOLIN HFA) 108 (90 Base) MCG/ACT inhaler Inhale into the lungs every 6 (six) hours as needed for wheezing or shortness of breath.    Marland Kitchen amLODipine (NORVASC) 5 MG tablet   3  . aspirin 81 MG tablet Take 81 mg by mouth daily.    . B Complex-C (SUPER B COMPLEX PO) Take 1 tablet by mouth daily.    . cilostazol (PLETAL) 100 MG tablet Take 100 mg by mouth 2 (two) times daily.    . Fluticasone-Salmeterol (ADVAIR DISKUS) 100-50 MCG/DOSE AEPB Inhale 1 puff into the lungs 2 (two) times daily.    Marland Kitchen HYDROcodone-acetaminophen (NORCO/VICODIN) 5-325 MG tablet   0  . ibuprofen (ADVIL,MOTRIN) 800 MG tablet   0  . ipratropium-albuterol (DUONEB) 0.5-2.5 (3) MG/3ML SOLN Take 3 mLs by nebulization every 6 (six) hours as needed (for shortness of breath/wheezing/congestion).    . metFORMIN (GLUCOPHAGE) 500 MG tablet Take 500 mg by mouth 2 (two) times daily.  3  . oxyCODONE-acetaminophen (ROXICET) 5-325 MG tablet Take 1 tablet by mouth every 6 (six) hours as needed for severe pain. 8 tablet 0  . simvastatin (ZOCOR) 20 MG  tablet Take 20 mg by mouth daily.  3   No current facility-administered medications for this visit.      REVIEW OF SYSTEMS: See HPI for pertinent positives and negatives.  Physical Examination Vitals:   11/15/17 1257 11/15/17 1259  BP: 119/65 120/79  Pulse: 70   Resp: 16   Temp: 98 F (36.7 C)   TempSrc: Oral   SpO2: 99%   Weight: 149 lb 9.6 oz (67.9 kg)   Height:  (1.727 m)    Body mass index is 22.75 kg/m.  General:  WDWN in NAD Gait: Normal HENT: WNL Eyes: PERRLA Pulmonary: normal  non-labored breathing, slightly limitedair movement in all fields, no rales, rhonchi, orwheezing. Cardiac: RRR, no murmur detected Abdomen: soft, NT, no masses palpated Skin: no rashes, no ulcers, no cellulitis.   VASCULAR EXAM  Carotid Bruits Right Left   negative negative   Bilateral radial pulses are 2+ palpable and =  Abdominal aortic pulse is not palpable.  VASCULAR EXAM: Extremitieswithoutischemic changes, withoutGangrene; withoutopen wounds.  LE Pulses Right Left  FEMORAL 2+palpable 2+palpable   POPLITEAL notpalpable  notpalpable  POSTERIOR TIBIAL 1+palpable   1+palpable   DORSALIS PEDIS ANTERIOR TIBIAL 1+palpable  1+palpable     Musculoskeletal: no muscle wasting or atrophy; no peripheral edema Neurologic:  A&O X 3; appropriate affect, sensation is normal; speech is normal, CN 2-12 intact, pain and light touch intact in extremities, motor exam as listed above. Psychiatric: Normal thought content, mood appropriate to clinical situation.      ASSESSMENT:  Andrea Horton is a 67 y.o. female who is s/p left CEA on 09-02-16. She has no known history of stroke or TIA.   She also has mild intermittent claudication of both calves that has been significantly alleviated by 100 mg cilostizol bid prescribed by her PCP since 2017.  Her bilateral pedal pulses are 1+  palpable.   Her atherosclerotic risk factors include active smoking since 1982 and apparently in control DM. She takes a daily ASA and a statin.  Her BMI is normal.     DATA  Carotid Duplex (11/15/17): Right ICA: 1-39% stenosis Left ICA: CEA site, with 40-59% stenosis Bilateral vertebral artery flow is antegrade.  Bilateral subclavian artery waveforms are normal.  Decreased stenosis in bilateral ICA compared to the exam on 05-17-17.      ABI (Date: 11/15/2017):  R:   ABI: 0.60 (was 0.59 on 05-17-17),   PT: mono (was bi)  DP: mono (was mono)  TBI:  0.59 (was 0.55)  L:   ABI: 0.97 (was 1.09),   PT: bi (was tri)  DP: bi (was tri)  TBI: 0.70 (was 0.69)  Stable bilateral ABI with moderate disease on the right, normal in the left.    PLAN:   I advised her to walk at least 30 minutes daily.  The patient was counseled re smoking cessation and given several free resources re smoking cessation.  Based on today's exam and non-invasive vascular lab results, the patient will follow up in 6 monthswith the following tests: carotid duplex and ABI's.    I discussed in depth with the patient the nature of atherosclerosis, and emphasized the importance of maximal medical management including strict control of blood pressure, blood glucose, and lipid levels, obtaining regular exercise, and cessation of smoking.  The patient is aware that without maximal medical management the underlying atherosclerotic disease process will progress, limiting the benefit of any interventions.  The patient was given information about stroke prevention and what symptoms should prompt the patient to seek immediate medical care.  The patient was given information about PAD including signs, symptoms, treatment, what symptoms should prompt the patient to seek immediate medical care, and risk reduction measures to take.  Thank you for allowing [place] to participate in this patient's care.  Charisse March, RN, MSN, FNP-C [facility] Office: [phone]  Clinic MD: Myra Gianotti 11/15/2017 1:24 PM

## 2017-11-15 NOTE — Patient Instructions (Signed)
Steps to Quit Smoking Smoking tobacco can be bad for your health. It can also affect almost every organ in your body. Smoking puts you and people around you at risk for many serious long-lasting (chronic) diseases. Quitting smoking is hard, but it is one of the best things that you can do for your health. It is never too late to quit. What are the benefits of quitting smoking? When you quit smoking, you lower your risk for getting serious diseases and conditions. They can include:  Lung cancer or lung disease.  Heart disease.  Stroke.  Heart attack.  Not being able to have children (infertility).  Weak bones (osteoporosis) and broken bones (fractures).  If you have coughing, wheezing, and shortness of breath, those symptoms may get better when you quit. You may also get sick less often. If you are pregnant, quitting smoking can help to lower your chances of having a baby of low birth weight. What can I do to help me quit smoking? Talk with your doctor about what can help you quit smoking. Some things you can do (strategies) include:  Quitting smoking totally, instead of slowly cutting back how much you smoke over a period of time.  Going to in-person counseling. You are more likely to quit if you go to many counseling sessions.  Using resources and support systems, such as: ? Agricultural engineer with a Veterinary surgeon. ? Phone quitlines. ? Automotive engineer. ? Support groups or group counseling. ? Text messaging programs. ? Mobile phone apps or applications.  Taking medicines. Some of these medicines may have nicotine in them. If you are pregnant or breastfeeding, do not take any medicines to quit smoking unless your doctor says it is okay. Talk with your doctor about counseling or other things that can help you.  Talk with your doctor about using more than one strategy at the same time, such as taking medicines while you are also going to in-person counseling. This can help make  quitting easier. What things can I do to make it easier to quit? Quitting smoking might feel very hard at first, but there is a lot that you can do to make it easier. Take these steps:  Talk to your family and friends. Ask them to support and encourage you.  Call phone quitlines, reach out to support groups, or work with a Veterinary surgeon.  Ask people who smoke to not smoke around you.  Avoid places that make you want (trigger) to smoke, such as: ? Bars. ? Parties. ? Smoke-break areas at work.  Spend time with people who do not smoke.  Lower the stress in your life. Stress can make you want to smoke. Try these things to help your stress: ? Getting regular exercise. Deep-breathing exercises. Stroke Prevention Some health problems and behaviors may make it more likely for you to have a stroke. Below are ways to lessen your risk of having a stroke. Be active for at least 30 minutes on most or all days. Do not smoke. Try not to be around others who smoke. Do not drink too much alcohol. Do not have more than 2 drinks a day if you are a man. Do not have more than 1 drink a day if you are a woman and are not pregnant. Eat healthy foods, such as fruits and vegetables. If you were put on a specific diet, follow the diet as told. Keep your cholesterol levels under control through diet and medicines. Look for foods that are low in  saturated fat, trans fat, cholesterol, and are high in fiber. If you have diabetes, follow all diet plans and take your medicine as told. Ask your doctor if you need treatment to lower your blood pressure. If you have high blood pressure (hypertension), follow all diet plans and take your medicine as told by your doctor. If you are 70-32 years old, have your blood pressure checked every 3-5 years. If you are age 8 or older, have your blood pressure checked every year. Keep a healthy weight. Eat foods that are low in calories, salt, saturated fat, trans fat, and  cholesterol. Do not take drugs. Avoid birth control pills, if this applies. Talk to your doctor about the risks of taking birth control pills. Talk to your doctor if you have sleep problems (sleep apnea). Take all medicine as told by your doctor. You may be told to take aspirin or blood thinner medicine. Take this medicine as told by your doctor. Understand your medicine instructions. Make sure any other conditions you have are being taken care of.  Get help right away if: You suddenly lose feeling (you feel numb) or have weakness in your face, arm, or leg. Your face or eyelid hangs down to one side. You suddenly feel confused. You have trouble talking (aphasia) or understanding what people are saying. You suddenly have trouble seeing in one or both eyes. You suddenly have trouble walking. You are dizzy. You lose your balance or your movements are clumsy (uncoordinated). You suddenly have a very bad headache and you do not know the cause. You have new chest pain. Your heart feels like it is fluttering or skipping a beat (irregular heartbeat). Do not wait to see if the symptoms above go away. Get help right away. Call your local emergency services (911 in [place] Do not drive yourself to the hospital. This information is not intended to replace advice given to you by your health care provider. Make sure you discuss any questions you have with your health care provider. Document Released: 01/05/2012 Document Revised: 12/12/2015 Document Reviewed: 01/06/2013 Elsevier Interactive Patient Education  [employer]. ?  ? Yoga. ? Meditating. ? Doing a body scan. To do this, close your eyes, focus on one area of your body at a time from head to toe, and notice which parts of your body are tense. Try to relax the muscles in those areas.  Download or buy apps on your mobile phone or tablet that can help you stick to your quit plan. There are many free apps, such as QuitGuide from the [employer]  Systems developer for Disease Control and Prevention). You can find more support from smokefree.gov and other websites.  This information is not intended to replace advice given to you by your health care provider. Make sure you discuss any questions you have with your health care provider. Document Released: 05/02/2009 Document Revised: 03/03/2016 Document Reviewed: 11/20/2014 Elsevier Interactive Patient Education  2018 Elsevier Inc.       Peripheral Vascular Disease Peripheral vascular disease (PVD) is a disease of the blood vessels that are not part of your heart and brain. A simple term for PVD is poor circulation. In most cases, PVD narrows the blood vessels that carry blood from your heart to the rest of your body. This can result in a decreased supply of blood to your arms, legs, and internal organs, like your stomach or kidneys. However, it most often affects a person's lower legs and feet. There are two types of PVD.  Organic PVD. This is the more common type. It is caused by damage to the structure of blood vessels.  Functional PVD. This is caused by conditions that make blood vessels contract and tighten (spasm).  Without treatment, PVD tends to get worse over time. PVD can also lead to acute ischemic limb. This is when an arm or limb suddenly has trouble getting enough blood. This is a medical emergency. Follow these instructions at home:  Take medicines only as told by your doctor.  Do not use any tobacco products, including cigarettes, chewing tobacco, or electronic cigarettes. If you need help quitting, ask your doctor.  Lose weight if you are overweight, and maintain a healthy weight as told by your doctor.  Eat a diet that is low in fat and cholesterol. If you need help, ask your doctor.  Exercise regularly. Ask your doctor for some good activities for you.  Take good care of your feet. ? Wear comfortable shoes that fit well. ? Check your feet often for any cuts or  sores. Contact a doctor if:  You have cramps in your legs while walking.  You have leg pain when you are at rest.  You have coldness in a leg or foot.  Your skin changes.  You are unable to get or have an erection (erectile dysfunction).  You have cuts or sores on your feet that are not healing. Get help right away if:  Your arm or leg turns cold and blue.  Your arms or legs become red, warm, swollen, painful, or numb.  You have chest pain or trouble breathing.  You suddenly have weakness in your face, arm, or leg.  You become very confused or you cannot speak.  You suddenly have a very bad headache.  You suddenly cannot see. This information is not intended to replace advice given to you by your health care provider. Make sure you discuss any questions you have with your health care provider. Document Released: 09/30/2009 Document Revised: 12/12/2015 Document Reviewed: 12/14/2013 Elsevier Interactive Patient Education  2017 [employer].

## 2017-11-26 ENCOUNTER — Other Ambulatory Visit: Payer: Self-pay

## 2017-11-26 DIAGNOSIS — I6522 Occlusion and stenosis of left carotid artery: Secondary | ICD-10-CM

## 2017-11-26 DIAGNOSIS — I70211 Atherosclerosis of native arteries of extremities with intermittent claudication, right leg: Secondary | ICD-10-CM

## 2018-01-28 DIAGNOSIS — E119 Type 2 diabetes mellitus without complications: Secondary | ICD-10-CM | POA: Diagnosis not present

## 2018-01-28 DIAGNOSIS — I10 Essential (primary) hypertension: Secondary | ICD-10-CM | POA: Diagnosis not present

## 2018-01-28 DIAGNOSIS — F17210 Nicotine dependence, cigarettes, uncomplicated: Secondary | ICD-10-CM | POA: Diagnosis not present

## 2018-01-28 DIAGNOSIS — I70211 Atherosclerosis of native arteries of extremities with intermittent claudication, right leg: Secondary | ICD-10-CM | POA: Diagnosis not present

## 2018-05-13 DIAGNOSIS — I10 Essential (primary) hypertension: Secondary | ICD-10-CM | POA: Diagnosis not present

## 2018-05-13 DIAGNOSIS — E119 Type 2 diabetes mellitus without complications: Secondary | ICD-10-CM | POA: Diagnosis not present

## 2018-05-13 DIAGNOSIS — J410 Simple chronic bronchitis: Secondary | ICD-10-CM | POA: Diagnosis not present

## 2018-05-13 DIAGNOSIS — F17200 Nicotine dependence, unspecified, uncomplicated: Secondary | ICD-10-CM | POA: Diagnosis not present

## 2018-05-13 DIAGNOSIS — Z23 Encounter for immunization: Secondary | ICD-10-CM | POA: Diagnosis not present

## 2018-05-17 ENCOUNTER — Encounter: Payer: Self-pay | Admitting: Family

## 2018-05-17 ENCOUNTER — Ambulatory Visit (HOSPITAL_COMMUNITY)
Admission: RE | Admit: 2018-05-17 | Discharge: 2018-05-17 | Disposition: A | Discharge status: Home / Self Care | Payer: Medicare Other | Source: Ambulatory Visit | Attending: Family | Admitting: Family

## 2018-05-17 ENCOUNTER — Ambulatory Visit (INDEPENDENT_AMBULATORY_CARE_PROVIDER_SITE_OTHER): Payer: Medicare Other | Admitting: Family

## 2018-05-17 ENCOUNTER — Other Ambulatory Visit: Payer: Self-pay

## 2018-05-17 ENCOUNTER — Ambulatory Visit (INDEPENDENT_AMBULATORY_CARE_PROVIDER_SITE_OTHER)
Admission: RE | Admit: 2018-05-17 | Discharge: 2018-05-17 | Disposition: A | Discharge status: Home / Self Care | Payer: Medicare Other | Source: Ambulatory Visit | Attending: Family | Admitting: Family

## 2018-05-17 VITALS — BP 171/82 | HR 108 | Temp 97.1°F | Resp 16 | Ht 67.0 in | Wt 144.0 lb

## 2018-05-17 DIAGNOSIS — I70211 Atherosclerosis of native arteries of extremities with intermittent claudication, right leg: Secondary | ICD-10-CM

## 2018-05-17 DIAGNOSIS — Z9889 Other specified postprocedural states: Secondary | ICD-10-CM

## 2018-05-17 DIAGNOSIS — I6522 Occlusion and stenosis of left carotid artery: Secondary | ICD-10-CM | POA: Insufficient documentation

## 2018-05-17 DIAGNOSIS — F17200 Nicotine dependence, unspecified, uncomplicated: Secondary | ICD-10-CM | POA: Diagnosis not present

## 2018-05-17 NOTE — Patient Instructions (Addendum)
Steps to Quit Smoking Smoking tobacco can be bad for your health. It can also affect almost every organ in your body. Smoking puts you and people around you at risk for many serious long-lasting (chronic) diseases. Quitting smoking is hard, but it is one of the best things that you can do for your health. It is never too late to quit. What are the benefits of quitting smoking? When you quit smoking, you lower your risk for getting serious diseases and conditions. They can include:  Lung cancer or lung disease.  Heart disease.  Stroke.  Heart attack.  Not being able to have children (infertility).  Weak bones (osteoporosis) and broken bones (fractures).  If you have coughing, wheezing, and shortness of breath, those symptoms may get better when you quit. You may also get sick less often. If you are pregnant, quitting smoking can help to lower your chances of having a baby of low birth weight. What can I do to help me quit smoking? Talk with your doctor about what can help you quit smoking. Some things you can do (strategies) include:  Quitting smoking totally, instead of slowly cutting back how much you smoke over a period of time.  Going to in-person counseling. You are more likely to quit if you go to many counseling sessions.  Using resources and support systems, such as: ? Online chats with a counselor. ? Phone quitlines. ? Printed self-help materials. ? Support groups or group counseling. ? Text messaging programs. ? Mobile phone apps or applications.  Taking medicines. Some of these medicines may have nicotine in them. If you are pregnant or breastfeeding, do not take any medicines to quit smoking unless your doctor says it is okay. Talk with your doctor about counseling or other things that can help you.  Talk with your doctor about using more than one strategy at the same time, such as taking medicines while you are also going to in-person counseling. This can help make  quitting easier. What things can I do to make it easier to quit? Quitting smoking might feel very hard at first, but there is a lot that you can do to make it easier. Take these steps:  Talk to your family and friends. Ask them to support and encourage you.  Call phone quitlines, reach out to support groups, or work with a counselor.  Ask people who smoke to not smoke around you.  Avoid places that make you want (trigger) to smoke, such as: ? Bars. ? Parties. ? Smoke-break areas at work.  Spend time with people who do not smoke.  Lower the stress in your life. Stress can make you want to smoke. Try these things to help your stress: ? Getting regular exercise. ? Deep-breathing exercises. ? Yoga. ? Meditating. ? Doing a body scan. To do this, close your eyes, focus on one area of your body at a time from head to toe, and notice which parts of your body are tense. Try to relax the muscles in those areas.  Download or buy apps on your mobile phone or tablet that can help you stick to your quit plan. There are many free apps, such as QuitGuide from the CDC (Centers for Disease Control and Prevention). You can find more support from smokefree.gov and other websites.  This information is not intended to replace advice given to you by your health care provider. Make sure you discuss any questions you have with your health care provider. Document Released: 05/02/2009 Document   Revised: 03/03/2016 Document Reviewed: 11/20/2014 Elsevier Interactive Patient Education  2018 Elsevier Inc.     Stroke Prevention Some health problems and behaviors may make it more likely for you to have a stroke. Below are ways to lessen your risk of having a stroke.  Be active for at least 30 minutes on most or all days.  Do not smoke. Try not to be around others who smoke.  Do not drink too much alcohol. ? Do not have more than 2 drinks a day if you are a man. ? Do not have more than 1 drink a day if you  are a woman and are not pregnant.  Eat healthy foods, such as fruits and vegetables. If you were put on a specific diet, follow the diet as told.  Keep your cholesterol levels under control through diet and medicines. Look for foods that are low in saturated fat, trans fat, cholesterol, and are high in fiber.  If you have diabetes, follow all diet plans and take your medicine as told.  Ask your doctor if you need treatment to lower your blood pressure. If you have high blood pressure (hypertension), follow all diet plans and take your medicine as told by your doctor.  If you are 18-39 years old, have your blood pressure checked every 3-5 years. If you are age 40 or older, have your blood pressure checked every year.  Keep a healthy weight. Eat foods that are low in calories, salt, saturated fat, trans fat, and cholesterol.  Do not take drugs.  Avoid birth control pills, if this applies. Talk to your doctor about the risks of taking birth control pills.  Talk to your doctor if you have sleep problems (sleep apnea).  Take all medicine as told by your doctor. ? You may be told to take aspirin or blood thinner medicine. Take this medicine as told by your doctor. ? Understand your medicine instructions.  Make sure any other conditions you have are being taken care of.  Get help right away if:  You suddenly lose feeling (you feel numb) or have weakness in your face, arm, or leg.  Your face or eyelid hangs down to one side.  You suddenly feel confused.  You have trouble talking (aphasia) or understanding what people are saying.  You suddenly have trouble seeing in one or both eyes.  You suddenly have trouble walking.  You are dizzy.  You lose your balance or your movements are clumsy (uncoordinated).  You suddenly have a very bad headache and you do not know the cause.  You have new chest pain.  Your heart feels like it is fluttering or skipping a beat (irregular  heartbeat). Do not wait to see if the symptoms above go away. Get help right away. Call your local emergency services (911 in [place] Do not drive yourself to the hospital. This information is not intended to replace advice given to you by your health care provider. Make sure you discuss any questions you have with your health care provider. Document Released: 01/05/2012 Document Revised: 12/12/2015 Document Reviewed: 01/06/2013 Elsevier Interactive Patient Education  2018 Elsevier Inc.      Intermittent Claudication Intermittent claudication is pain in your leg that occurs when you walk or exercise and goes away when you rest. The pain can occur in one or both legs. What are the causes? Intermittent claudication is caused by the buildup of plaque within the major arteries in the body (atherosclerosis). The plaque, which makes arteries stiff   and narrow, prevents enough blood from reaching your leg muscles. The pain occurs when you walk or exercise because your muscles need more blood when you are moving and exercising. What increases the risk? Risk factors include:  A family history of atherosclerosis.  A personal history of stroke or heart disease.  Older age.  Being inactive or overweight.  Smoking cigarettes.  Having another health condition such as: ? Diabetes. ? High blood pressure. ? High cholesterol.  What are the signs or symptoms? Your hip or leg may:  Ache.  Cramp.  Feel tight.  Feel weak.  Feel heavy.  Over time, you may feel pain in your calf, thigh, or hip. How is this diagnosed? Your health care provider may diagnose intermittent claudication based on your symptoms and medical history. Your health care provider may also do tests to learn more about your condition. These may include:  Blood tests.  An ultrasound.  Imaging tests such as angiography, magnetic resonance angiography (MRA), and computed tomography angiography (CTA).  How is this  treated? You may be treated for problems such as:  High blood pressure.  High cholesterol.  Diabetes.  Other treatments may include:  Lifestyle changes such as: ? Starting an exercise program. ? Losing weight. ? Quitting smoking.  Medicines to help restore blood flow through your legs.  Blood vessel surgery (angioplasty) to restore blood flow if your intermittent claudication is caused by severe peripheral artery disease.  Follow these instructions at home:  Manage any other health conditions you have.  Eat a diet low in saturated fats and calories to maintain a healthy weight.  Quit smoking, if you smoke.  Take medicines only as directed by your health care provider.  If your health care provider recommended an exercise program for you, follow it as directed. Your exercise program may involve: ? Walking three or more times a week. ? Walking until you have certain symptoms of intermittent claudication. ? Resting until symptoms go away. ? Gradually increasing walking time to about 50 minutes a day. Contact a health care provider if: Your condition is not getting better or is getting worse. Get help right away if:  You have chest pain.  You have difficulty breathing.  You develop arm weakness.  You have trouble speaking.  Your face begins to droop. This information is not intended to replace advice given to you by your health care provider. Make sure you discuss any questions you have with your health care provider. Document Released: 05/08/2004 Document Revised: 12/12/2015 Document Reviewed: 10/12/2013 Elsevier Interactive Patient Education  2017 Elsevier Inc.  

## 2018-05-17 NOTE — Progress Notes (Signed)
[facility]  HISTORY AND PHYSICAL   CC: Follow up extracranial carotid artery stenosis and peripheral artery occlusive disease    History of Present Illness:   Andrea Horton is a 67 y.o. female who is s/p left CEA on 09-02-16 by Dr. Darrick Penna. She also has mild intermittent claudication of both calves that has been significantly alleviated by 100 mg cilostizol bid prescribed by her PCP since 2017.   The patient denies any history of TIA or stroke symptoms, specifically she denies a history of amaurosis fugax or monocular blindness, unilateral facial drooping, hemiplegia, orreceptive or expressive aphasia.   She is taking cilostizol since 2017, 100 mg bid prescribed by her PCP, and pt states this allows her to walk farther, states she can walk at least 20 minutes leisurely to a store that she frequents; when her legs do bother her it is bilateral calves, relieved by rest. She denies any non healing wounds in her feet or legs.   Patient had bilateral ABIs performed previouslywhich were 0.6 on the right 1.03 on the left.[facility]. She had an arterial duplex exam at our office on 11-16-17which showed greater than 75% stenosis of the mid and distal right superficial femoral artery. She has right calf claudication after she returns walking from a nearby store, denies claudication+ sx's in left leg. .  Other medical problems include COPD and she is on several inhalers and nebulizer for this.  She reports dizziness upon standing which resolves in a short period of time; she also admits to not drinking enough water.  Diabetic: Yes, states in control, no A1C result on file Tobacco ZOX:WRUEAVW smoker (1/3ppd, started in 1982)  Pt meds include: Statin :Yes Betablocker: No ASA: Yes Other anticoagulants/antiplatelets: no    Current Outpatient Medications  Medication Sig Dispense Refill  . acetaminophen (TYLENOL)  500 MG tablet Take 1,000 mg by mouth every 6 (six) hours as needed (for pain).    Marland Kitchen albuterol (PROVENTIL HFA;VENTOLIN HFA) 108 (90 Base) MCG/ACT inhaler Inhale into the lungs every 6 (six) hours as needed for wheezing or shortness of breath.    Marland Kitchen amLODipine (NORVASC) 5 MG tablet   3  . aspirin 81 MG tablet Take 81 mg by mouth daily.    . B Complex-C (SUPER B COMPLEX PO) Take 1 tablet by mouth daily.    . cilostazol (PLETAL) 100 MG tablet Take 100 mg by mouth 2 (two) times daily.    . Fluticasone-Salmeterol (ADVAIR DISKUS) 100-50 MCG/DOSE AEPB Inhale 1 puff into the lungs 2 (two) times daily.    Marland Kitchen ibuprofen (ADVIL,MOTRIN) 800 MG tablet   0  . ipratropium-albuterol (DUONEB) 0.5-2.5 (3) MG/3ML SOLN Take 3 mLs by nebulization every 6 (six) hours as needed (for shortness of breath/wheezing/congestion).    . metFORMIN (GLUCOPHAGE) 500 MG tablet Take 500 mg by mouth 2 (two) times daily.  3  . simvastatin (ZOCOR) 20 MG tablet Take 20 mg by mouth daily.  3  . HYDROcodone-acetaminophen (NORCO/VICODIN) 5-325 MG tablet   0  . oxyCODONE-acetaminophen (ROXICET) 5-325 MG tablet Take 1 tablet by mouth every 6 (six) hours as needed for severe pain. (Patient not taking: Reported on 05/17/2018) 8 tablet 0   No current facility-administered medications for this visit.     Past Medical History:  Diagnosis Date  . Asthma   . Bronchitis   . COPD (chronic obstructive pulmonary disease) (HCC)   . Diabetes mellitus without complication (HCC)   .  GERD (gastroesophageal reflux disease)   . History of kidney stones   . Hypertension     Social History Social History   Tobacco Use  . Smoking status: Current Every Day Smoker    Packs/day: 1.00    Years: 35.00    Pack years: 35.00    Types: Cigarettes    Start date: 04/04/1981  . Smokeless tobacco: Never Used  Substance Use Topics  . Alcohol use: No  . Drug use: Yes    Types: Marijuana    Comment: smoked Crack 20 yers ago, will smoke marijuana every once in  a while    Family History Family History  Problem Relation Age of Onset  . Heart disease Mother        before age 25  . Heart disease Sister        before age 6  . Heart disease Brother        before age 73    Surgical History Past Surgical History:  Procedure Laterality Date  . ABDOMINAL HYSTERECTOMY     partial  . ENDARTERECTOMY Left 09/02/2016   Procedure: ENDARTERECTOMY CAROTID LEFT;  Surgeon: Sherren Kerns, MD;  Location: [facility] OR;  Service: Vascular;  Laterality: Left;  . PATCH ANGIOPLASTY Left 09/02/2016   Procedure: PATCH ANGIOPLASTY LEFT CAROTID ARTERY;  Surgeon: Sherren Kerns, MD;  Location: [facility] OR;  Service: Vascular;  Laterality: Left;  . TUBAL LIGATION      Allergies  Allergen Reactions  . No Known Allergies     Current Outpatient Medications  Medication Sig Dispense Refill  . acetaminophen (TYLENOL) 500 MG tablet Take 1,000 mg by mouth every 6 (six) hours as needed (for pain).    Marland Kitchen albuterol (PROVENTIL HFA;VENTOLIN HFA) 108 (90 Base) MCG/ACT inhaler Inhale into the lungs every 6 (six) hours as needed for wheezing or shortness of breath.    Marland Kitchen amLODipine (NORVASC) 5 MG tablet   3  . aspirin 81 MG tablet Take 81 mg by mouth daily.    . B Complex-C (SUPER B COMPLEX PO) Take 1 tablet by mouth daily.    . cilostazol (PLETAL) 100 MG tablet Take 100 mg by mouth 2 (two) times daily.    . Fluticasone-Salmeterol (ADVAIR DISKUS) 100-50 MCG/DOSE AEPB Inhale 1 puff into the lungs 2 (two) times daily.    Marland Kitchen ibuprofen (ADVIL,MOTRIN) 800 MG tablet   0  . ipratropium-albuterol (DUONEB) 0.5-2.5 (3) MG/3ML SOLN Take 3 mLs by nebulization every 6 (six) hours as needed (for shortness of breath/wheezing/congestion).    . metFORMIN (GLUCOPHAGE) 500 MG tablet Take 500 mg by mouth 2 (two) times daily.  3  . simvastatin (ZOCOR) 20 MG tablet Take 20 mg by mouth daily.  3  . HYDROcodone-acetaminophen (NORCO/VICODIN) 5-325 MG tablet   0  . oxyCODONE-acetaminophen (ROXICET) 5-325 MG  tablet Take 1 tablet by mouth every 6 (six) hours as needed for severe pain. (Patient not taking: Reported on 05/17/2018) 8 tablet 0   No current facility-administered medications for this visit.      REVIEW OF SYSTEMS: See HPI for pertinent positives and negatives.  Physical Examination Vitals:   05/17/18 1322 05/17/18 1326  BP: (!) 156/83 (!) 171/82  Pulse: (!) 103 (!) 108  Resp: 16   Temp: (!) 97.1 F (36.2 C)   TempSrc: Oral   SpO2: 100%   Weight: 144 lb (65.3 kg)   Height: 5\' 7"  (1.702 m)    Body mass index is 22.55 kg/m.  General:  WDWN in NAD Gait:Normal HENT: WNL Eyes:PERRLA Pulmonary:normal non-labored breathing, fair air movement in all fields, no rales, rhonchi, orwheezing. Cardiac:RRR, no murmur detected Abdomen:soft, NT, no masses palpated Skin:no rashes, no ulcers, no cellulitis.   VASCULAR EXAM  Carotid Bruits Right Left   negative negative   Bilateral radial pulses are 2+ palpable and =  Abdominal aortic pulse is not palpable.  VASCULAR EXAM: Extremitieswithoutischemic changes, withoutGangrene; withoutopen wounds.  LE Pulses Right Left  FEMORAL 2+palpable 2+palpable   POPLITEAL notpalpable  notpalpable  POSTERIOR TIBIAL 1+palpable   1+palpable   DORSALIS PEDIS ANTERIOR TIBIAL 1+palpable  2+palpable     Musculoskeletal: no muscle wasting or atrophy; no peripheral edema Neurologic:  A&O X 3; appropriate affect, sensation is normal; speech is normal, CN 2-12 intact, pain and light touch intact in extremities, motor exam as listed above. Psychiatric: Normal thought content, mood appropriate to clinical situation.     ASSESSMENT:  KENSLY BOWMER is a 67 y.o. female who is s/p left CEA on 09-02-16. She has no known history of stroke or TIA.   She also has mild intermittent claudication of both calves that has been significantly alleviated  by 100 mg cilostizol bid prescribed by her PCP since 2017.  Her bilateral pedal pulses are 1+ and 2+ palpable.   Her atherosclerotic risk factors include active smoking since 1982and apparently in control DM. She takes a daily ASA and a statin.  Her BMI is normal. She walks a great deal.    DATA  Carotid Duplex (05-17-18): Right ICA: 40-59% stenosis Left ICA: CEA site with 60-79% stenosis Bilateral vertebral artery flow is antegrade.  Bilateral subclavian artery waveforms are normal.  Increased stenosis in the bilateral ICA compared to the exam on 11-15-17, but same as the exam on 05-17-17.    ABI (Date: 05/17/2018):  R:   ABI: 0.70 (was 0.60 on 11-15-17),   PT: bi  DP: bi  TBI:  0.58, toe pressure 94, (was 0.59)  L:   ABI: 1.01 (was 0.97),   PT: tri  DP: tri  TBI: 0.71, toe pressure 116, (was 0.70) Improved right ABI to moderate disease with biphasic waveforms Left ABI remains normal with triphasic waveforms.  Stable bilateral TBI.     PLAN:  Continue to walk at least 30 minutes daily.  The patient was counseled re smoking cessation and given several free resources re smoking cessation.  Based on today's exam and non-invasive vascular lab results,the patient will follow up in17monthswith the following tests: carotid duplex and ABI's.  I discussed in depth with the patient the nature of atherosclerosis, and emphasized the importance of maximal medical management including strict control of blood pressure, blood glucose, and lipid levels, obtaining regular exercise, and cessation of smoking.  The patient is aware that without maximal medical management the underlying atherosclerotic disease process will progress, limiting the benefit of any interventions.  The patient was given information about stroke prevention and what symptoms should prompt the patient to seek immediate medical care.  The patient was given information about PAD including signs,  symptoms, treatment, what symptoms should prompt the patient to seek immediate medical care, and risk reduction measures to take.  Thank you for allowing [place] to participate in this patient's care.  Charisse March, RN, MSN, FNP-C [facility] Office: [phone]  Clinic MD: Bonnye Fava 05/17/2018 1:41 PM

## 2018-06-24 DIAGNOSIS — E785 Hyperlipidemia, unspecified: Secondary | ICD-10-CM | POA: Diagnosis not present

## 2018-06-24 DIAGNOSIS — Z0000 Encounter for general adult medical examination without abnormal findings: Secondary | ICD-10-CM | POA: Diagnosis not present

## 2018-06-24 DIAGNOSIS — F17210 Nicotine dependence, cigarettes, uncomplicated: Secondary | ICD-10-CM | POA: Diagnosis not present

## 2018-06-24 DIAGNOSIS — J449 Chronic obstructive pulmonary disease, unspecified: Secondary | ICD-10-CM | POA: Diagnosis not present

## 2018-06-24 DIAGNOSIS — Z1331 Encounter for screening for depression: Secondary | ICD-10-CM | POA: Diagnosis not present

## 2018-06-24 DIAGNOSIS — R7301 Impaired fasting glucose: Secondary | ICD-10-CM | POA: Diagnosis not present

## 2018-06-24 DIAGNOSIS — E782 Mixed hyperlipidemia: Secondary | ICD-10-CM | POA: Diagnosis not present

## 2018-06-24 DIAGNOSIS — F17200 Nicotine dependence, unspecified, uncomplicated: Secondary | ICD-10-CM | POA: Diagnosis not present

## 2018-06-24 DIAGNOSIS — Z1389 Encounter for screening for other disorder: Secondary | ICD-10-CM | POA: Diagnosis not present

## 2018-06-24 DIAGNOSIS — E119 Type 2 diabetes mellitus without complications: Secondary | ICD-10-CM | POA: Diagnosis not present

## 2018-06-24 DIAGNOSIS — I739 Peripheral vascular disease, unspecified: Secondary | ICD-10-CM | POA: Diagnosis not present

## 2018-06-24 DIAGNOSIS — I158 Other secondary hypertension: Secondary | ICD-10-CM | POA: Diagnosis not present

## 2018-06-24 DIAGNOSIS — I6522 Occlusion and stenosis of left carotid artery: Secondary | ICD-10-CM | POA: Diagnosis not present

## 2018-07-06 ENCOUNTER — Other Ambulatory Visit: Payer: Self-pay

## 2018-07-06 ENCOUNTER — Other Ambulatory Visit: Payer: Self-pay | Admitting: Internal Medicine

## 2018-07-06 ENCOUNTER — Ambulatory Visit (INDEPENDENT_AMBULATORY_CARE_PROVIDER_SITE_OTHER): Payer: Medicare Other

## 2018-07-06 DIAGNOSIS — R011 Cardiac murmur, unspecified: Secondary | ICD-10-CM

## 2018-07-08 DIAGNOSIS — J449 Chronic obstructive pulmonary disease, unspecified: Secondary | ICD-10-CM | POA: Diagnosis not present

## 2018-07-08 DIAGNOSIS — I739 Peripheral vascular disease, unspecified: Secondary | ICD-10-CM | POA: Diagnosis not present

## 2018-07-08 DIAGNOSIS — I10 Essential (primary) hypertension: Secondary | ICD-10-CM | POA: Diagnosis not present

## 2018-07-08 DIAGNOSIS — E119 Type 2 diabetes mellitus without complications: Secondary | ICD-10-CM | POA: Diagnosis not present

## 2018-07-09 DIAGNOSIS — Z1212 Encounter for screening for malignant neoplasm of rectum: Secondary | ICD-10-CM | POA: Diagnosis not present

## 2018-07-09 DIAGNOSIS — Z1211 Encounter for screening for malignant neoplasm of colon: Secondary | ICD-10-CM | POA: Diagnosis not present

## 2018-09-23 DIAGNOSIS — I70211 Atherosclerosis of native arteries of extremities with intermittent claudication, right leg: Secondary | ICD-10-CM | POA: Diagnosis not present

## 2018-09-23 DIAGNOSIS — E119 Type 2 diabetes mellitus without complications: Secondary | ICD-10-CM | POA: Diagnosis not present

## 2018-09-23 DIAGNOSIS — J449 Chronic obstructive pulmonary disease, unspecified: Secondary | ICD-10-CM | POA: Diagnosis not present

## 2018-09-23 DIAGNOSIS — I10 Essential (primary) hypertension: Secondary | ICD-10-CM | POA: Diagnosis not present

## 2018-10-21 ENCOUNTER — Other Ambulatory Visit: Payer: Self-pay

## 2018-10-21 DIAGNOSIS — Z9889 Other specified postprocedural states: Secondary | ICD-10-CM

## 2018-10-21 DIAGNOSIS — I6522 Occlusion and stenosis of left carotid artery: Secondary | ICD-10-CM

## 2018-10-21 DIAGNOSIS — I70211 Atherosclerosis of native arteries of extremities with intermittent claudication, right leg: Secondary | ICD-10-CM

## 2018-10-30 DIAGNOSIS — Z7984 Long term (current) use of oral hypoglycemic drugs: Secondary | ICD-10-CM | POA: Diagnosis not present

## 2018-10-30 DIAGNOSIS — Z79899 Other long term (current) drug therapy: Secondary | ICD-10-CM | POA: Diagnosis not present

## 2018-10-30 DIAGNOSIS — E785 Hyperlipidemia, unspecified: Secondary | ICD-10-CM | POA: Diagnosis not present

## 2018-10-30 DIAGNOSIS — Z87442 Personal history of urinary calculi: Secondary | ICD-10-CM | POA: Diagnosis not present

## 2018-10-30 DIAGNOSIS — R0781 Pleurodynia: Secondary | ICD-10-CM | POA: Diagnosis not present

## 2018-10-30 DIAGNOSIS — F17200 Nicotine dependence, unspecified, uncomplicated: Secondary | ICD-10-CM | POA: Diagnosis not present

## 2018-10-30 DIAGNOSIS — J449 Chronic obstructive pulmonary disease, unspecified: Secondary | ICD-10-CM | POA: Diagnosis not present

## 2018-10-30 DIAGNOSIS — R109 Unspecified abdominal pain: Secondary | ICD-10-CM | POA: Diagnosis not present

## 2018-10-30 DIAGNOSIS — I10 Essential (primary) hypertension: Secondary | ICD-10-CM | POA: Diagnosis not present

## 2018-10-31 ENCOUNTER — Encounter (HOSPITAL_COMMUNITY): Payer: Medicare Other

## 2018-10-31 ENCOUNTER — Ambulatory Visit: Payer: Medicare Other | Admitting: Family

## 2018-12-01 DIAGNOSIS — I70211 Atherosclerosis of native arteries of extremities with intermittent claudication, right leg: Secondary | ICD-10-CM | POA: Diagnosis not present

## 2018-12-01 DIAGNOSIS — I739 Peripheral vascular disease, unspecified: Secondary | ICD-10-CM | POA: Diagnosis not present

## 2018-12-01 DIAGNOSIS — J449 Chronic obstructive pulmonary disease, unspecified: Secondary | ICD-10-CM | POA: Diagnosis not present

## 2018-12-01 DIAGNOSIS — I10 Essential (primary) hypertension: Secondary | ICD-10-CM | POA: Diagnosis not present

## 2019-02-08 DIAGNOSIS — I70211 Atherosclerosis of native arteries of extremities with intermittent claudication, right leg: Secondary | ICD-10-CM | POA: Diagnosis not present

## 2019-02-08 DIAGNOSIS — J449 Chronic obstructive pulmonary disease, unspecified: Secondary | ICD-10-CM | POA: Diagnosis not present

## 2019-02-08 DIAGNOSIS — E119 Type 2 diabetes mellitus without complications: Secondary | ICD-10-CM | POA: Diagnosis not present

## 2019-02-08 DIAGNOSIS — F17210 Nicotine dependence, cigarettes, uncomplicated: Secondary | ICD-10-CM | POA: Diagnosis not present

## 2019-02-08 DIAGNOSIS — I10 Essential (primary) hypertension: Secondary | ICD-10-CM | POA: Diagnosis not present

## 2019-03-11 DIAGNOSIS — I10 Essential (primary) hypertension: Secondary | ICD-10-CM | POA: Diagnosis not present

## 2019-03-11 DIAGNOSIS — E119 Type 2 diabetes mellitus without complications: Secondary | ICD-10-CM | POA: Diagnosis not present

## 2021-03-13 ENCOUNTER — Emergency Department (HOSPITAL_COMMUNITY)
Admission: EM | Admit: 2021-03-13 | Discharge: 2021-03-13 | Disposition: A | Discharge status: Home / Self Care | Payer: Medicare Other | Attending: Emergency Medicine | Admitting: Emergency Medicine

## 2021-03-13 ENCOUNTER — Emergency Department (HOSPITAL_COMMUNITY): Payer: Medicare Other

## 2021-03-13 ENCOUNTER — Encounter (HOSPITAL_COMMUNITY): Payer: Self-pay | Admitting: *Deleted

## 2021-03-13 ENCOUNTER — Other Ambulatory Visit: Payer: Self-pay

## 2021-03-13 DIAGNOSIS — R55 Syncope and collapse: Secondary | ICD-10-CM | POA: Diagnosis present

## 2021-03-13 DIAGNOSIS — E119 Type 2 diabetes mellitus without complications: Secondary | ICD-10-CM | POA: Insufficient documentation

## 2021-03-13 DIAGNOSIS — F17210 Nicotine dependence, cigarettes, uncomplicated: Secondary | ICD-10-CM | POA: Insufficient documentation

## 2021-03-13 DIAGNOSIS — Z7984 Long term (current) use of oral hypoglycemic drugs: Secondary | ICD-10-CM | POA: Insufficient documentation

## 2021-03-13 DIAGNOSIS — J449 Chronic obstructive pulmonary disease, unspecified: Secondary | ICD-10-CM | POA: Insufficient documentation

## 2021-03-13 DIAGNOSIS — J45909 Unspecified asthma, uncomplicated: Secondary | ICD-10-CM | POA: Diagnosis not present

## 2021-03-13 DIAGNOSIS — E876 Hypokalemia: Secondary | ICD-10-CM | POA: Diagnosis not present

## 2021-03-13 DIAGNOSIS — Z7951 Long term (current) use of inhaled steroids: Secondary | ICD-10-CM | POA: Insufficient documentation

## 2021-03-13 DIAGNOSIS — I10 Essential (primary) hypertension: Secondary | ICD-10-CM | POA: Insufficient documentation

## 2021-03-13 DIAGNOSIS — Z79899 Other long term (current) drug therapy: Secondary | ICD-10-CM | POA: Diagnosis not present

## 2021-03-13 LAB — CBC WITH DIFFERENTIAL/PLATELET
Abs Immature Granulocytes: 0.04 10*3/uL (ref 0.00–0.07)
Basophils Absolute: 0.1 10*3/uL (ref 0.0–0.1)
Basophils Relative: 0 %
Eosinophils Absolute: 0.2 10*3/uL (ref 0.0–0.5)
Eosinophils Relative: 2 %
HCT: 32.4 % — ABNORMAL LOW (ref 36.0–46.0)
Hemoglobin: 10.7 g/dL — ABNORMAL LOW (ref 12.0–15.0)
Immature Granulocytes: 0 %
Lymphocytes Relative: 28 %
Lymphs Abs: 3.5 10*3/uL (ref 0.7–4.0)
MCH: 28.8 pg (ref 26.0–34.0)
MCHC: 33 g/dL (ref 30.0–36.0)
MCV: 87.1 fL (ref 80.0–100.0)
Monocytes Absolute: 0.9 10*3/uL (ref 0.1–1.0)
Monocytes Relative: 7 %
Neutro Abs: 7.8 10*3/uL — ABNORMAL HIGH (ref 1.7–7.7)
Neutrophils Relative %: 63 %
Platelets: 309 10*3/uL (ref 150–400)
RBC: 3.72 MIL/uL — ABNORMAL LOW (ref 3.87–5.11)
RDW: 15.4 % (ref 11.5–15.5)
WBC: 12.5 10*3/uL — ABNORMAL HIGH (ref 4.0–10.5)
nRBC: 0 % (ref 0.0–0.2)

## 2021-03-13 LAB — BASIC METABOLIC PANEL
Anion gap: 10 (ref 5–15)
BUN: 14 mg/dL (ref 8–23)
CO2: 24 mmol/L (ref 22–32)
Calcium: 9.2 mg/dL (ref 8.9–10.3)
Chloride: 105 mmol/L (ref 98–111)
Creatinine, Ser: 1 mg/dL (ref 0.44–1.00)
GFR, Estimated: >60 mL/min (ref >60)
Glucose, Bld: 91 mg/dL (ref 70–99)
Potassium: 3 mmol/L — ABNORMAL LOW (ref 3.5–5.1)
Sodium: 139 mmol/L (ref 135–145)

## 2021-03-13 MED ORDER — POTASSIUM CHLORIDE CRYS ER 20 MEQ PO TBCR
40.0000 meq | EXTENDED_RELEASE_TABLET | Freq: Once | ORAL | Status: AC
  Administered 2021-03-13: 40 meq via ORAL
  Filled 2021-03-13: qty 2

## 2021-03-13 NOTE — ED Triage Notes (Signed)
Pt with generalized weakness and cough, not able to sleep.  Unknown of covid exposure.

## 2021-03-13 NOTE — ED Provider Notes (Signed)
[facility] EMERGENCY DEPARTMENT Provider Note  CSN: [id number] Arrival date & time: 03/13/21 1214    History Chief Complaint  Patient presents with   weakness    Andrea Horton is a 70 y.o. female reports she was in her normal state of health when she woke up this morning. She had gotten up and had breakfast when she began to feel hot and lightheaded. She was not having any CP or SOB. She was having a coughing attack similar to previous with her asthma. She went to a neighbor's house and symptoms resolved. She has been feeling fine since then. Denies any fever, sputum, abdominal pain, N/V/D or leg swelling.    Past Medical History:  Diagnosis Date   Asthma    Bronchitis    COPD (chronic obstructive pulmonary disease) (HCC)    Diabetes mellitus without complication (HCC)    GERD (gastroesophageal reflux disease)    History of kidney stones    Hypertension     Past Surgical History:  Procedure Laterality Date   ABDOMINAL HYSTERECTOMY     partial   ENDARTERECTOMY Left 09/02/2016   Procedure: ENDARTERECTOMY CAROTID LEFT;  Surgeon: Sherren Kerns, MD;  Location: [facility] OR;  Service: Vascular;  Laterality: Left;   PATCH ANGIOPLASTY Left 09/02/2016   Procedure: PATCH ANGIOPLASTY LEFT CAROTID ARTERY;  Surgeon: Sherren Kerns, MD;  Location: [facility] OR;  Service: Vascular;  Laterality: Left;   TUBAL LIGATION      Family History  Problem Relation Age of Onset   Heart disease Mother        before age 19   Heart disease Sister        before age 77   Heart disease Brother        before age 80    Social History   Tobacco Use   Smoking status: Every Day    Packs/day: 1.00    Years: 35.00    Pack years: 35.00    Types: Cigarettes    Start date: 04/04/1981   Smokeless tobacco: Never  Substance Use Topics   Alcohol use: No   Drug use: Yes    Types: Marijuana    Comment: smoked Crack 20 yers ago, will smoke marijuana every once in a while     Home Medications Prior to  Admission medications   Medication Sig Start Date End Date Taking? Authorizing Provider  acetaminophen (TYLENOL) 500 MG tablet Take 1,000 mg by mouth every 6 (six) hours as needed (for pain).    [provider]  albuterol (PROVENTIL HFA;VENTOLIN HFA) 108 (90 Base) MCG/ACT inhaler Inhale into the lungs every 6 (six) hours as needed for wheezing or shortness of breath.    [provider]  amLODipine (NORVASC) 5 MG tablet  10/30/17   [provider]  aspirin 81 MG tablet Take 81 mg by mouth daily.    [provider]  B Complex-C (SUPER B COMPLEX PO) Take 1 tablet by mouth daily.    [provider]  cilostazol (PLETAL) 100 MG tablet Take 100 mg by mouth 2 (two) times daily.    [provider]  Fluticasone-Salmeterol (ADVAIR DISKUS) 100-50 MCG/DOSE AEPB Inhale 1 puff into the lungs 2 (two) times daily.    [provider]  HYDROcodone-acetaminophen (NORCO/VICODIN) 5-325 MG tablet  10/20/17   [provider]  ibuprofen (ADVIL,MOTRIN) 800 MG tablet  10/20/17   [provider]  ipratropium-albuterol (DUONEB) 0.5-2.5 (3) MG/3ML SOLN Take 3 mLs by nebulization every  6 (six) hours as needed (for shortness of breath/wheezing/congestion).    [provider]  metFORMIN (GLUCOPHAGE) 500 MG tablet Take 500 mg by mouth 2 (two) times daily. 04/24/16   [provider]  oxyCODONE-acetaminophen (ROXICET) 5-325 MG tablet Take 1 tablet by mouth every 6 (six) hours as needed for severe pain. Patient not taking: Reported on 05/17/2018 09/03/16   Dara Lords, PA-C  simvastatin (ZOCOR) 20 MG tablet Take 20 mg by mouth daily. 04/24/16   [provider]     Allergies    No known allergies   Review of Systems   Review of Systems A comprehensive review of systems was completed and negative except as noted in HPI.    Physical Exam BP (!) 159/67 (BP Location: Left Arm)   Pulse 88   Temp 98.3 F (36.8 C) (Oral)    Resp 16   Ht 5\' 7"  (1.702 m)   Wt 67.1 kg   SpO2 100%   BMI 23.18 kg/m   Physical Exam Vitals and nursing note reviewed.  Constitutional:      Appearance: Normal appearance.  HENT:     Head: Normocephalic and atraumatic.     Nose: Nose normal.     Mouth/Throat:     Mouth: Mucous membranes are moist.  Eyes:     Extraocular Movements: Extraocular movements intact.     Conjunctiva/sclera: Conjunctivae normal.  Cardiovascular:     Rate and Rhythm: Normal rate.  Pulmonary:     Effort: Pulmonary effort is normal.     Breath sounds: Normal breath sounds. No wheezing or rales.  Abdominal:     General: Abdomen is flat.     Palpations: Abdomen is soft.     Tenderness: There is no abdominal tenderness.  Musculoskeletal:        General: No swelling. Normal range of motion.     Cervical back: Neck supple.     Right lower leg: No edema.     Left lower leg: No edema.  Skin:    General: Skin is warm and dry.  Neurological:     General: No focal deficit present.     Mental Status: She is alert and oriented to person, place, and time.     Cranial Nerves: No cranial nerve deficit.     Sensory: No sensory deficit.     Motor: No weakness.  Psychiatric:        Mood and Affect: Mood normal.     ED Results / Procedures / Treatments   Labs (all labs ordered are listed, but only abnormal results are displayed) Labs Reviewed  CBC WITH DIFFERENTIAL/PLATELET - Abnormal; Notable for the following components:      Result Value   WBC 12.5 (*)    RBC 3.72 (*)    Hemoglobin 10.7 (*)    HCT 32.4 (*)    Neutro Abs 7.8 (*)    All other components within normal limits  BASIC METABOLIC PANEL - Abnormal; Notable for the following components:   Potassium 3.0 (*)    All other components within normal limits  URINALYSIS, ROUTINE W REFLEX MICROSCOPIC    EKG EKG Interpretation  Date/Time:  Thursday March 13 2021 16:03:28 EDT Ventricular Rate:  89 PR Interval:  208 QRS Duration: 100 QT  Interval:  410 QTC Calculation: 498 R Axis:   25 Text Interpretation: Normal sinus rhythm Nonspecific ST and T wave abnormality Borderline QT interval Abnormal ECG No significant change since last tracing Confirmed by 06-20-1991,  Leonette Most [phone]) on 03/13/2021 4:05:44 PM  Radiology DG Chest 2 View  Result Date: 03/13/2021 CLINICAL DATA:  cough, gen weakness September 27, 2018. EXAM: CHEST - 2 VIEW COMPARISON:  Multiple priors, most recent 01/25/2010. FINDINGS: The heart size and mediastinal contours are within normal limits. Chronic bronchitic type lung changes with increased lung markings at the lung bases. No consolidation. No visible pleural effusions or pneumothorax. Left greater than right shoulder degenerative change. IMPRESSION: Chronic bronchitic type lung changes, similar to prior. No definite superimposed acute cardiopulmonary disease. Electronically Signed   By: Feliberto Harts M.D.   On: 03/13/2021 13:27    Procedures Procedures  Medications Ordered in the ED Medications  potassium chloride SA (KLOR-CON) CR tablet 40 mEq (40 mEq Oral Given 03/13/21 1554)     MDM Rules/Calculators/A&P MDM Labs ordered in triage reviewed, CBC with mild leukocystosis and mild anemia, both similar to previous labs. She is not on a regular oral steroid. She has mild hypokalemia. CXR without acute process. Will add EKG and then discuss dispo. She has been asymptomatic since arrival. Exam is benign. She did not have LOC or fall/injury.   ED Course  I have reviewed the triage vital signs and the nursing notes.  Pertinent labs & imaging results that were available during my care of the patient were reviewed by me and considered in my medical decision making (see chart for details).  Clinical Course as of 03/13/21 1613  Thu Mar 13, 2021  1609 Patient's K repleted, orally, EKG without acute chagnes. Remains asymptomatic. She would like to go home, recommend close PCP follow up and RTED for worsening or  worrisome symptoms.  [CS]    Clinical Course User Index [CS] Pollyann Savoy, MD    Final Clinical Impression(s) / ED Diagnoses Final diagnoses:  Near syncope    Rx / DC Orders ED Discharge Orders     None        Pollyann Savoy, MD 03/13/21 [phone]

## 2021-04-15 ENCOUNTER — Emergency Department (HOSPITAL_COMMUNITY)
Admission: EM | Admit: 2021-04-15 | Discharge: 2021-04-15 | Disposition: A | Discharge status: Home / Self Care | Payer: Medicare Other | Attending: Emergency Medicine | Admitting: Emergency Medicine

## 2021-04-15 ENCOUNTER — Encounter (HOSPITAL_COMMUNITY): Payer: Self-pay | Admitting: *Deleted

## 2021-04-15 ENCOUNTER — Other Ambulatory Visit: Payer: Self-pay

## 2021-04-15 DIAGNOSIS — J45909 Unspecified asthma, uncomplicated: Secondary | ICD-10-CM | POA: Insufficient documentation

## 2021-04-15 DIAGNOSIS — T171XXA Foreign body in nostril, initial encounter: Secondary | ICD-10-CM | POA: Diagnosis present

## 2021-04-15 DIAGNOSIS — J449 Chronic obstructive pulmonary disease, unspecified: Secondary | ICD-10-CM | POA: Diagnosis not present

## 2021-04-15 DIAGNOSIS — I10 Essential (primary) hypertension: Secondary | ICD-10-CM | POA: Insufficient documentation

## 2021-04-15 DIAGNOSIS — X58XXXA Exposure to other specified factors, initial encounter: Secondary | ICD-10-CM | POA: Diagnosis not present

## 2021-04-15 DIAGNOSIS — R0981 Nasal congestion: Secondary | ICD-10-CM | POA: Insufficient documentation

## 2021-04-15 DIAGNOSIS — E119 Type 2 diabetes mellitus without complications: Secondary | ICD-10-CM | POA: Insufficient documentation

## 2021-04-15 DIAGNOSIS — F17210 Nicotine dependence, cigarettes, uncomplicated: Secondary | ICD-10-CM | POA: Insufficient documentation

## 2021-04-15 DIAGNOSIS — R6889 Other general symptoms and signs: Secondary | ICD-10-CM

## 2021-04-15 NOTE — ED Triage Notes (Signed)
States she has something in her nose and it has been there for awhile

## 2021-04-15 NOTE — Discharge Instructions (Signed)
I did not see any foreign body in your nose. I have provided the name of an ear, nose, and throat doctor locally for follow up.

## 2021-04-15 NOTE — ED Provider Notes (Signed)
Emergency Department Provider Note   I have reviewed the triage vital signs and the nursing notes.   HISTORY  Chief Complaint Foreign Body in Nose   HPI Andrea Horton is a 70 y.o. female with PMH reviewed below presents to the ED with FB sensation in the nose for "a Marwah Disbro time." She describes feeling like there is a "plastic film" lining both nostrils. She began to feel this at the start of the COVID pandemic and has not spoken to or seen any MD regarding this. Denies SOB, fever, chills, or CP. No epistaxis. No radiation of symptoms or modifying factors.    Past Medical History:  Diagnosis Date   Asthma    Bronchitis    COPD (chronic obstructive pulmonary disease) (HCC)    Diabetes mellitus without complication (HCC)    GERD (gastroesophageal reflux disease)    History of kidney stones    Hypertension     Patient Active Problem List   Diagnosis Date Noted   Asymptomatic carotid artery stenosis, left 09/02/2016    Past Surgical History:  Procedure Laterality Date   ABDOMINAL HYSTERECTOMY     partial   ENDARTERECTOMY Left 09/02/2016   Procedure: ENDARTERECTOMY CAROTID LEFT;  Surgeon: Sherren Kerns, MD;  Location: [facility] OR;  Service: Vascular;  Laterality: Left;   PATCH ANGIOPLASTY Left 09/02/2016   Procedure: PATCH ANGIOPLASTY LEFT CAROTID ARTERY;  Surgeon: Sherren Kerns, MD;  Location: [facility] OR;  Service: Vascular;  Laterality: Left;   TUBAL LIGATION      Allergies No known allergies  Family History  Problem Relation Age of Onset   Heart disease Mother        before age 21   Heart disease Sister        before age 70   Heart disease Brother        before age 22    Social History Social History   Tobacco Use   Smoking status: Every Day    Packs/day: 1.00    Years: 35.00    Pack years: 35.00    Types: Cigarettes    Start date: 04/04/1981   Smokeless tobacco: Never  Substance Use Topics   Alcohol use: No   Drug use: Yes    Types: Marijuana     Comment: smoked Crack 20 yers ago, will smoke marijuana every once in a while    Review of Systems  Constitutional: No fever/chills Eyes: No visual changes. ENT: No sore throat. FB sensation in the bilateral nostrils.  Cardiovascular: Denies chest pain. Respiratory: Denies shortness of breath. Gastrointestinal: No abdominal pain.  No nausea, no vomiting.  No diarrhea.  No constipation. Genitourinary: Negative for dysuria. Musculoskeletal: Negative for back pain. Skin: Negative for rash. Neurological: Negative for headaches, focal weakness or numbness.  10-point ROS otherwise negative.  ____________________________________________   PHYSICAL EXAM:  VITAL SIGNS: ED Triage Vitals  Enc Vitals Group     BP 04/15/21 1150 (!) 144/67     Pulse Rate 04/15/21 1150 82     Resp 04/15/21 1150 (!) 22     Temp 04/15/21 1150 98.1 F (36.7 C)     Temp Source 04/15/21 1150 Oral     SpO2 04/15/21 1150 100 %   Constitutional: Alert and oriented. Well appearing and in no acute distress. Eyes: Conjunctivae are normal. PERRL. EOMI. Head: Atraumatic. Nose: No congestion/rhinnorhea. No nasal FB visualized. No epistaxis. Mucous membranes are dry.  Mouth/Throat: Mucous membranes are moist.  Oropharynx non-erythematous. Neck: No  stridor.   Cardiovascular: Good peripheral circulation.  Respiratory: Normal respiratory effort.   Gastrointestinal: No distention.  Musculoskeletal: No gross deformities of extremities. Neurologic:  Normal speech and language.    ____________________________________________   PROCEDURES  Procedure(s) performed:   Procedures  None ____________________________________________   INITIAL IMPRESSION / ASSESSMENT AND PLAN / ED COURSE  Pertinent labs & imaging results that were available during my care of the patient were reviewed by me and considered in my medical decision making (see chart for details).   Patient presents to the ED with nasal FB sensation x  months to years. No FB visualized. No abnormal nasal discharge. No epistaxis. Nasal mucosa are dry. Advised OTC nasal saline spray and provided contact information for local ENT for follow up. Patient apparently walked out of the ED prior to receiving her discharge paperwork per nursing.    ____________________________________________  FINAL CLINICAL IMPRESSION(S) / ED DIAGNOSES  Final diagnoses:  Nose congested  Sensation of foreign body      Note:  This document was prepared using Dragon voice recognition software and may include unintentional dictation errors.  Alona Bene, MD, [facility]    Warner Laduca, Arlyss Repress, MD 04/18/21 1026

## 2021-05-12 ENCOUNTER — Observation Stay (HOSPITAL_COMMUNITY)
Admission: EM | Admit: 2021-05-12 | Discharge: 2021-05-13 | Disposition: A | Discharge status: Home / Self Care | Payer: Medicare Other | Attending: Internal Medicine | Admitting: Internal Medicine

## 2021-05-12 ENCOUNTER — Other Ambulatory Visit: Payer: Self-pay

## 2021-05-12 ENCOUNTER — Emergency Department (HOSPITAL_COMMUNITY): Payer: Medicare Other

## 2021-05-12 ENCOUNTER — Encounter (HOSPITAL_COMMUNITY): Payer: Self-pay | Admitting: Internal Medicine

## 2021-05-12 DIAGNOSIS — Z79899 Other long term (current) drug therapy: Secondary | ICD-10-CM | POA: Diagnosis not present

## 2021-05-12 DIAGNOSIS — Z7984 Long term (current) use of oral hypoglycemic drugs: Secondary | ICD-10-CM | POA: Insufficient documentation

## 2021-05-12 DIAGNOSIS — E119 Type 2 diabetes mellitus without complications: Secondary | ICD-10-CM | POA: Insufficient documentation

## 2021-05-12 DIAGNOSIS — D649 Anemia, unspecified: Secondary | ICD-10-CM

## 2021-05-12 DIAGNOSIS — E876 Hypokalemia: Secondary | ICD-10-CM | POA: Diagnosis not present

## 2021-05-12 DIAGNOSIS — R195 Other fecal abnormalities: Secondary | ICD-10-CM

## 2021-05-12 DIAGNOSIS — Z20822 Contact with and (suspected) exposure to covid-19: Secondary | ICD-10-CM | POA: Diagnosis not present

## 2021-05-12 DIAGNOSIS — F17210 Nicotine dependence, cigarettes, uncomplicated: Secondary | ICD-10-CM | POA: Insufficient documentation

## 2021-05-12 DIAGNOSIS — K317 Polyp of stomach and duodenum: Secondary | ICD-10-CM | POA: Diagnosis not present

## 2021-05-12 DIAGNOSIS — D62 Acute posthemorrhagic anemia: Secondary | ICD-10-CM | POA: Diagnosis not present

## 2021-05-12 DIAGNOSIS — J449 Chronic obstructive pulmonary disease, unspecified: Secondary | ICD-10-CM | POA: Diagnosis not present

## 2021-05-12 DIAGNOSIS — K31819 Angiodysplasia of stomach and duodenum without bleeding: Secondary | ICD-10-CM | POA: Diagnosis not present

## 2021-05-12 DIAGNOSIS — I10 Essential (primary) hypertension: Secondary | ICD-10-CM | POA: Diagnosis not present

## 2021-05-12 DIAGNOSIS — D509 Iron deficiency anemia, unspecified: Secondary | ICD-10-CM | POA: Diagnosis present

## 2021-05-12 HISTORY — DX: Pure hypercholesterolemia, unspecified: E78.00

## 2021-05-12 LAB — PREPARE RBC (CROSSMATCH)

## 2021-05-12 LAB — COMPREHENSIVE METABOLIC PANEL
ALT: 11 U/L (ref 0–44)
AST: 11 U/L — ABNORMAL LOW (ref 15–41)
Albumin: 3.8 g/dL (ref 3.5–5.0)
Alkaline Phosphatase: 71 U/L (ref 38–126)
Anion gap: 10 (ref 5–15)
BUN: 20 mg/dL (ref 8–23)
CO2: 19 mmol/L — ABNORMAL LOW (ref 22–32)
Calcium: 8.7 mg/dL — ABNORMAL LOW (ref 8.9–10.3)
Chloride: 108 mmol/L (ref 98–111)
Creatinine, Ser: 1.22 mg/dL — ABNORMAL HIGH (ref 0.44–1.00)
GFR, Estimated: 48 mL/min — ABNORMAL LOW (ref >60)
Glucose, Bld: 86 mg/dL (ref 70–99)
Potassium: 2.5 mmol/L — CL (ref 3.5–5.1)
Sodium: 137 mmol/L (ref 135–145)
Total Bilirubin: 0.3 mg/dL (ref 0.3–1.2)
Total Protein: 7.3 g/dL (ref 6.5–8.1)

## 2021-05-12 LAB — CBC WITH DIFFERENTIAL/PLATELET
Abs Immature Granulocytes: 0.05 10*3/uL (ref 0.00–0.07)
Basophils Absolute: 0 10*3/uL (ref 0.0–0.1)
Basophils Relative: 0 %
Eosinophils Absolute: 0.2 10*3/uL (ref 0.0–0.5)
Eosinophils Relative: 1 %
HCT: 21.8 % — ABNORMAL LOW (ref 36.0–46.0)
Hemoglobin: 6.8 g/dL — CL (ref 12.0–15.0)
Immature Granulocytes: 0 %
Lymphocytes Relative: 30 %
Lymphs Abs: 3.4 10*3/uL (ref 0.7–4.0)
MCH: 24.6 pg — ABNORMAL LOW (ref 26.0–34.0)
MCHC: 31.2 g/dL (ref 30.0–36.0)
MCV: 79 fL — ABNORMAL LOW (ref 80.0–100.0)
Monocytes Absolute: 0.7 10*3/uL (ref 0.1–1.0)
Monocytes Relative: 6 %
Neutro Abs: 6.8 10*3/uL (ref 1.7–7.7)
Neutrophils Relative %: 63 %
Platelets: 451 10*3/uL — ABNORMAL HIGH (ref 150–400)
RBC: 2.76 MIL/uL — ABNORMAL LOW (ref 3.87–5.11)
RDW: 15.4 % (ref 11.5–15.5)
WBC: 11.2 10*3/uL — ABNORMAL HIGH (ref 4.0–10.5)
nRBC: 0 % (ref 0.0–0.2)

## 2021-05-12 LAB — VITAMIN B12: Vitamin B-12: 250 pg/mL (ref 180–914)

## 2021-05-12 LAB — MAGNESIUM: Magnesium: 1.8 mg/dL (ref 1.7–2.4)

## 2021-05-12 LAB — RETICULOCYTES
Immature Retic Fract: 27 % — ABNORMAL HIGH (ref 2.3–15.9)
RBC.: 2.94 MIL/uL — ABNORMAL LOW (ref 3.87–5.11)
Retic Count, Absolute: 61.2 10*3/uL (ref 19.0–186.0)
Retic Ct Pct: 2.1 % (ref 0.4–3.1)

## 2021-05-12 LAB — RESP PANEL BY RT-PCR (FLU A&B, COVID) ARPGX2
Influenza A by PCR: NEGATIVE
Influenza B by PCR: NEGATIVE
SARS Coronavirus 2 by RT PCR: NEGATIVE

## 2021-05-12 LAB — TROPONIN I (HIGH SENSITIVITY)
Troponin I (High Sensitivity): 2 ng/L (ref <18)
Troponin I (High Sensitivity): 2 ng/L (ref <18)

## 2021-05-12 LAB — IRON AND TIBC
Iron: 11 ug/dL — ABNORMAL LOW (ref 28–170)
Saturation Ratios: 3 % — ABNORMAL LOW (ref 10.4–31.8)
TIBC: 397 ug/dL (ref 250–450)
UIBC: 386 ug/dL

## 2021-05-12 LAB — POC OCCULT BLOOD, ED: Fecal Occult Bld: POSITIVE — AB

## 2021-05-12 LAB — TSH: TSH: 1.408 u[IU]/mL (ref 0.350–4.500)

## 2021-05-12 LAB — FERRITIN: Ferritin: 4 ng/mL — ABNORMAL LOW (ref 11–307)

## 2021-05-12 LAB — FOLATE: Folate: 14.4 ng/mL (ref >5.9)

## 2021-05-12 LAB — T4, FREE: Free T4: 1.33 ng/dL — ABNORMAL HIGH (ref 0.61–1.12)

## 2021-05-12 LAB — LACTIC ACID, PLASMA: Lactic Acid, Venous: 1 mmol/L (ref 0.5–1.9)

## 2021-05-12 MED ORDER — CILOSTAZOL 100 MG PO TABS
100.0000 mg | ORAL_TABLET | Freq: Two times a day (BID) | ORAL | Status: DC
  Administered 2021-05-12: 100 mg via ORAL
  Filled 2021-05-12 (×2): qty 1

## 2021-05-12 MED ORDER — LORAZEPAM 1 MG PO TABS
1.0000 mg | ORAL_TABLET | Freq: Once | ORAL | Status: AC
  Administered 2021-05-12: 1 mg via ORAL
  Filled 2021-05-12: qty 1

## 2021-05-12 MED ORDER — FLUTICASONE PROPIONATE 50 MCG/ACT NA SUSP
2.0000 | Freq: Every day | NASAL | Status: DC
  Administered 2021-05-12: 2 via NASAL
  Filled 2021-05-12: qty 16

## 2021-05-12 MED ORDER — SIMVASTATIN 20 MG PO TABS
20.0000 mg | ORAL_TABLET | Freq: Every day | ORAL | Status: DC
  Filled 2021-05-12: qty 1

## 2021-05-12 MED ORDER — UMECLIDINIUM BROMIDE 62.5 MCG/ACT IN AEPB
1.0000 | INHALATION_SPRAY | Freq: Every day | RESPIRATORY_TRACT | Status: DC
  Administered 2021-05-13: 1 via RESPIRATORY_TRACT
  Filled 2021-05-12: qty 7

## 2021-05-12 MED ORDER — FLUTICASONE FUROATE-VILANTEROL 100-25 MCG/ACT IN AEPB
1.0000 | INHALATION_SPRAY | Freq: Every day | RESPIRATORY_TRACT | Status: DC
  Administered 2021-05-13: 1 via RESPIRATORY_TRACT
  Filled 2021-05-12: qty 28

## 2021-05-12 MED ORDER — FLUTICASONE-UMECLIDIN-VILANT 100-62.5-25 MCG/ACT IN AEPB: 1.0000 | INHALATION_SPRAY | Freq: Every day | RESPIRATORY_TRACT | Status: DC

## 2021-05-12 MED ORDER — AMLODIPINE BESYLATE 5 MG PO TABS
5.0000 mg | ORAL_TABLET | Freq: Every day | ORAL | Status: DC
  Filled 2021-05-12: qty 1

## 2021-05-12 MED ORDER — SODIUM CHLORIDE 0.9 % IV SOLN
10.0000 mL/h | Freq: Once | INTRAVENOUS | Status: AC
  Administered 2021-05-12: 10 mL/h via INTRAVENOUS

## 2021-05-12 MED ORDER — ONDANSETRON HCL 4 MG PO TABS: 4.0000 mg | ORAL_TABLET | Freq: Four times a day (QID) | ORAL | Status: DC | PRN

## 2021-05-12 MED ORDER — ONDANSETRON HCL 4 MG/2ML IJ SOLN: 4.0000 mg | Freq: Four times a day (QID) | INTRAMUSCULAR | Status: DC | PRN

## 2021-05-12 MED ORDER — PANTOPRAZOLE SODIUM 40 MG IV SOLR: 40.0000 mg | INTRAVENOUS | Status: DC

## 2021-05-12 MED ORDER — POTASSIUM CHLORIDE CRYS ER 20 MEQ PO TBCR
40.0000 meq | EXTENDED_RELEASE_TABLET | Freq: Once | ORAL | Status: AC
  Administered 2021-05-12: 40 meq via ORAL
  Filled 2021-05-12: qty 2

## 2021-05-12 MED ORDER — IPRATROPIUM-ALBUTEROL 0.5-2.5 (3) MG/3ML IN SOLN: 3.0000 mL | Freq: Four times a day (QID) | RESPIRATORY_TRACT | Status: DC | PRN

## 2021-05-12 MED ORDER — ACETAMINOPHEN 500 MG PO TABS: 1000.0000 mg | ORAL_TABLET | Freq: Four times a day (QID) | ORAL | Status: DC | PRN

## 2021-05-12 MED ORDER — POTASSIUM CHLORIDE 10 MEQ/100ML IV SOLN
10.0000 meq | Freq: Once | INTRAVENOUS | Status: AC
  Administered 2021-05-12: 10 meq via INTRAVENOUS
  Filled 2021-05-12: qty 100

## 2021-05-12 MED ORDER — PANTOPRAZOLE SODIUM 40 MG IV SOLR
40.0000 mg | Freq: Once | INTRAVENOUS | Status: AC
  Administered 2021-05-12: 40 mg via INTRAVENOUS
  Filled 2021-05-12: qty 40

## 2021-05-12 MED ORDER — PANTOPRAZOLE SODIUM 40 MG IV SOLR
40.0000 mg | Freq: Two times a day (BID) | INTRAVENOUS | Status: DC
  Administered 2021-05-12 – 2021-05-13 (×2): 40 mg via INTRAVENOUS
  Filled 2021-05-12 (×2): qty 40

## 2021-05-12 NOTE — ED Notes (Signed)
Date and time results received: 05/12/21 1:37 PM  Test: hemoglobin Critical Value: 6.8  Name of Provider Notified: [email or web address]  Orders Received? Or Actions Taken?: md notified

## 2021-05-12 NOTE — Consult Note (Addendum)
Referring Provider: Dr. Arbutus Leas  Primary Care Physician:  Avon Gully, MD Primary Gastroenterologist:  Dr. Marletta Lor, previously unassigned  Date of Admission: 05/12/21 Date of Consultation: 05/12/21  Reason for Consultation:  Hgb 6.8, heme positive   HPI:  Andrea Horton is a 70 y.o. year old female presenting to the ED today with reports of "panic attacks" that have been worsening, associated shortness of breath, and difficulty sleeping. Presenting Hgb today is 6.8. Heme positive. Anemia panel in process. Hgb in Aug 2022 was 10.7. Difficult historian. 1 unit PRBCs has been ordered.   She notes occasional nausea and rare vomiting. Vague reports and unable to illicit much information. She does endorse Ibuprofen use for knee pain and dental pain. Good appetite. No changes in bowel habits. Denies black, tarry stool or hematochezia. Denies any family history of colorectal cancer or polyps. She has never had a colonoscopy or endoscopy.   Past Medical History:  Diagnosis Date   Asthma    Bronchitis    COPD (chronic obstructive pulmonary disease) (HCC)    Diabetes mellitus without complication (HCC)    GERD (gastroesophageal reflux disease)    History of kidney stones    Hypercholesterolemia    Hypertension     Past Surgical History:  Procedure Laterality Date   ABDOMINAL HYSTERECTOMY     partial   ENDARTERECTOMY Left 09/02/2016   Procedure: ENDARTERECTOMY CAROTID LEFT;  Surgeon: Sherren Kerns, MD;  Location: [facility] OR;  Service: Vascular;  Laterality: Left;   PATCH ANGIOPLASTY Left 09/02/2016   Procedure: PATCH ANGIOPLASTY LEFT CAROTID ARTERY;  Surgeon: Sherren Kerns, MD;  Location: [facility] OR;  Service: Vascular;  Laterality: Left;   TUBAL LIGATION      Prior to Admission medications   Medication Sig Start Date End Date Taking? Authorizing Provider  acetaminophen (TYLENOL) 500 MG tablet Take 1,000 mg by mouth every 6 (six) hours as needed (for pain).   Yes [provider]   albuterol (PROVENTIL HFA;VENTOLIN HFA) 108 (90 Base) MCG/ACT inhaler Inhale into the lungs every 6 (six) hours as needed for wheezing or shortness of breath.   Yes [provider]  amLODipine (NORVASC) 5 MG tablet Take 5 mg by mouth daily. 10/30/17  Yes [provider]  aspirin 81 MG tablet Take 81 mg by mouth daily.   Yes [provider]  cilostazol (PLETAL) 100 MG tablet Take 100 mg by mouth 2 (two) times daily.   Yes [provider]  fluticasone (FLONASE) 50 MCG/ACT nasal spray Place 2 sprays into both nostrils daily. 02/26/21  Yes [provider]  Fluticasone-Salmeterol (ADVAIR) 100-50 MCG/DOSE AEPB Inhale 1 puff into the lungs 2 (two) times daily.   Yes [provider]  Fluticasone-Umeclidin-Vilant (TRELEGY ELLIPTA) 100-62.5-25 MCG/ACT AEPB Inhale 1 puff into the lungs daily. 04/17/21  Yes [provider]  ipratropium-albuterol (DUONEB) 0.5-2.5 (3) MG/3ML SOLN Take 3 mLs by nebulization every 6 (six) hours as needed (for shortness of breath/wheezing/congestion).   Yes [provider]  metFORMIN (GLUCOPHAGE) 500 MG tablet Take 500 mg by mouth 2 (two) times daily. 04/24/16  Yes [provider]  simvastatin (ZOCOR) 20 MG tablet Take 20 mg by mouth daily. 04/24/16  Yes [provider]  HYDROcodone-acetaminophen (NORCO/VICODIN) 5-325 MG tablet  10/20/17   [provider]  ibuprofen (ADVIL,MOTRIN) 800 MG tablet  10/20/17   [provider]  oxyCODONE-acetaminophen (ROXICET) 5-325 MG tablet Take 1 tablet by mouth every 6 (six) hours as needed for severe pain. Patient  not taking: Reported on 05/17/2018 09/03/16   Dara Lords, PA-C    Current Facility-Administered Medications  Medication Dose Route Frequency Provider Last Rate Last Admin   0.9 %  sodium chloride infusion  10 mL/hr Intravenous Once Pricilla Loveless, MD       Current Outpatient Medications  Medication Sig Dispense Refill    acetaminophen (TYLENOL) 500 MG tablet Take 1,000 mg by mouth every 6 (six) hours as needed (for pain).     albuterol (PROVENTIL HFA;VENTOLIN HFA) 108 (90 Base) MCG/ACT inhaler Inhale into the lungs every 6 (six) hours as needed for wheezing or shortness of breath.     amLODipine (NORVASC) 5 MG tablet Take 5 mg by mouth daily.  3   aspirin 81 MG tablet Take 81 mg by mouth daily.     cilostazol (PLETAL) 100 MG tablet Take 100 mg by mouth 2 (two) times daily.     fluticasone (FLONASE) 50 MCG/ACT nasal spray Place 2 sprays into both nostrils daily.     Fluticasone-Salmeterol (ADVAIR) 100-50 MCG/DOSE AEPB Inhale 1 puff into the lungs 2 (two) times daily.     Fluticasone-Umeclidin-Vilant 100-62.5-25 MCG/ACT AEPB Inhale 1 puff into the lungs daily.     ipratropium-albuterol (DUONEB) 0.5-2.5 (3) MG/3ML SOLN Take 3 mLs by nebulization every 6 (six) hours as needed (for shortness of breath/wheezing/congestion).     metFORMIN (GLUCOPHAGE) 500 MG tablet Take 500 mg by mouth 2 (two) times daily.  3   simvastatin (ZOCOR) 20 MG tablet Take 20 mg by mouth daily.  3   oxyCODONE-acetaminophen (ROXICET) 5-325 MG tablet Take 1 tablet by mouth every 6 (six) hours as needed for severe pain. (Patient not taking: No sig reported) 8 tablet 0    Allergies as of 05/12/2021 - Review Complete 05/12/2021  Allergen Reaction Noted   No known allergies  09/01/2016    Family History  Problem Relation Age of Onset   Heart disease Mother        before age 46   Heart disease Sister        before age 4   Heart disease Brother        before age 42   Colon cancer Neg Hx    Colon polyps Neg Hx     Social History   Socioeconomic History   Marital status: Single    Spouse name: Not on file   Number of children: Not on file   Years of education: Not on file   Highest education level: Not on file  Occupational History   Not on file  Tobacco Use   Smoking status: Every Day    Packs/day: 1.00    Years: 35.00    Pack  years: 35.00    Types: Cigarettes    Start date: 04/04/1981   Smokeless tobacco: Never  Substance and Sexual Activity   Alcohol use: No   Drug use: Yes    Types: Marijuana    Comment: smoked cocaine 20 yers ago, will smoke marijuana every once in a while   Sexual activity: Not on file  Other Topics Concern   Not on file  Social History Narrative   Not on file   Social Determinants of Health   Financial Resource Strain: Not on file  Food Insecurity: Not on file  Transportation Needs: Not on file  Physical Activity: Not on file  Stress: Not on file  Social Connections: Not on file  Intimate Partner Violence: Not on file    Review  of Systems: Gen: Denies fever, chills, loss of appetite, change in weight or weight loss CV: Denies chest pain, heart palpitations, syncope, edema  Resp: see HPI GI: see HPI GU : Denies urinary burning, urinary frequency, urinary incontinence.  MS: Denies joint pain,swelling, cramping Derm: Denies rash, itching, dry skin Psych: Denies depression, anxiety,confusion, or memory loss Heme: Denies bruising, bleeding, and enlarged lymph nodes.  Physical Exam: Vital signs in last 24 hours: Temp:  [97.7 F (36.5 C)] 97.7 F (36.5 C) (10/24 1139) Pulse Rate:  [91] 91 (10/24 1139) Resp:  [17] 17 (10/24 1139) BP: (141)/(64) 141/64 (10/24 1139) SpO2:  [98 %] 98 % (10/24 1139) Weight:  [72.1 kg] 72.1 kg (10/24 1138)   General:   Alert,  Well-developed, well-nourished, pleasant and cooperative in NAD Head:  Normocephalic and atraumatic. Eyes:  Sclera clear, no icterus.    Ears:  Normal auditory acuity. Nose:  No deformity, discharge,  or lesions. Lungs:  Clear throughout to auscultation.    Heart:  S1 S2 present without murmurs Abdomen:  Soft, nontender and nondistended. No masses, hepatosplenomegaly or hernias noted. Normal bowel sounds, without guarding, and without rebound.   Rectal:  Deferred   Msk:  Symmetrical without gross deformities. Normal  posture. Extremities:  Without edema. Neurologic:  Alert and  oriented x4 Psych:  Alert and cooperative. Normal mood and affect.  Intake/Output from previous day: No intake/output data recorded. Intake/Output this shift: No intake/output data recorded.  Lab Results: Recent Labs    05/12/21 1245  WBC 11.2*  HGB 6.8*  HCT 21.8*  PLT 451*   BMET Recent Labs    05/12/21 1245  NA 137  K 2.5*  CL 108  CO2 19*  GLUCOSE 86  BUN 20  CREATININE 1.22*  CALCIUM 8.7*   LFT Recent Labs    05/12/21 1245  PROT 7.3  ALBUMIN 3.8  AST 11*  ALT 11  ALKPHOS 71  BILITOT 0.3    Studies/Results: DG Chest Portable 1 View  Result Date: 05/12/2021 CLINICAL DATA:  Dyspnea, feeling of panic attack EXAM: PORTABLE CHEST 1 VIEW COMPARISON:  Chest radiograph 03/13/2021 FINDINGS: The cardiomediastinal silhouette is normal. The lungs are clear, with no focal consolidation or pulmonary edema. There is no pleural effusion or pneumothorax. There is no acute osseous abnormality. IMPRESSION: No radiographic evidence of acute cardiopulmonary process. Electronically Signed   By: Lesia Hausen M.D.   On: 05/12/2021 13:21    Impression:  70 y.o. year old female presenting to the ED today symptomatic acute blood loss anemia, heme positive stool, and no evidence for obvious GI bleeding such as melena or hematochezia. Presenting Hgb 6.8, with 1 unit PRBCs ordered. Upon review of chart, last Hgb in Aug 2022 was 10.7.    Vague reports of nausea and rare vomiting per patient. She is overall a difficult historian. No prior colonoscopy/EGD. No FH of colorectal cancer or polyps. She also endorses Ibuprofen use and takes 81 mg aspirin daily. Suspected UGI source but unable to exclude occult lower GI etiology.   Will pursue EGD first on 10/25. If this is negative, can consider colonoscopy inpatient vs early interval outpatient.   Hypokalemia: replacement per hospitalist.  Plan: Clear liquids NPO after  midnight PPI IV daily Will reassess in am and pursue EGD. Continue resuscitation in interim.  Follow-up on anemia panel as comes available  Gelene Mink, PhD, ANP-BC [facility]      LOS: 0 days    05/12/2021, 4:13 PM

## 2021-05-12 NOTE — ED Triage Notes (Signed)
Pt with feelings of a panic attack, been years since last one.  Pt called her PCP [place] and was instructed to come here.  Pt states she has not been able to sleep at night. Pt with SOB, denies any chest pain.

## 2021-05-12 NOTE — H&P (Signed)
History and Physical  Andrea Horton:[id number] DOB: 11/05/1950 DOA: 05/12/2021   PCP: Avon Gully, MD   Patient coming from: Home  Chief Complaint: "panic attack"  HPI:  Andrea Horton is a 70 y.o. female with medical history of diabetes mellitus, hypertension, COPD presenting with a sensation of a "panic attack".  The patient states that her sensation of having a panic attack has become more frequent.  She states that she was feeling more anxious and having more shortness of breath with exertion.  Otherwise, the patient has some trouble describing what she is feeling.  She denies any fevers, chills, chest pain, headache, neck pain, nausea, vomiting, diarrhea, abdominal pain.  She does have a cough with white mucus.  She denies any hematochezia or melena.  She denies any NSAID usage.  She denies any illicit drug use or alcohol.  She states that she is not able to sleep.  ED In the emergency department, the patient was afebrile hemodynamically stable with oxygen saturation 98% on room air.  BMP showed sodium 137, potassium 2.5, CO2 19, serum creatinine 1.22.  LFTs were unremarkable.  WBC 11.2, hemoglobin 6.8, platelets 451,000.  Chest x-ray was negative for any infiltrates or edema.  EKG showed sinus rhythm with no ST-T wave changes.   FOBT was positive.  The patient was given potassium 40 p.o. and 10 IV.  Ativan 1 mg was given.  Assessment/Plan: Symptomatic Anemia/FOBT+ -GI consult -presented with Hgb 6.8 -baseline Hgb 10 -one unit PRBC ordered -clear liquids -anemia panel -Start Protonix -hold ASA  HTN -continue amlodipine  Diabetes Mellitus type 2  -hold metformin -ISS -A1C  Tobacco abuse -at least 30 pack year history -cessation discussed  COPD -stable on RA -continue Advair  Hyperlipidemia -Change simvastatin to atorvastatin secondary to interaction with amlodipine  Hypokalemia -repleted -check mag       Past Medical History:  Diagnosis Date    Asthma    Bronchitis    COPD (chronic obstructive pulmonary disease) (HCC)    Diabetes mellitus without complication (HCC)    GERD (gastroesophageal reflux disease)    History of kidney stones    Hypertension    Past Surgical History:  Procedure Laterality Date   ABDOMINAL HYSTERECTOMY     partial   ENDARTERECTOMY Left 09/02/2016   Procedure: ENDARTERECTOMY CAROTID LEFT;  Surgeon: Sherren Kerns, MD;  Location: [facility] OR;  Service: Vascular;  Laterality: Left;   PATCH ANGIOPLASTY Left 09/02/2016   Procedure: PATCH ANGIOPLASTY LEFT CAROTID ARTERY;  Surgeon: Sherren Kerns, MD;  Location: [facility] OR;  Service: Vascular;  Laterality: Left;   TUBAL LIGATION     Social History:  reports that she has been smoking cigarettes. She started smoking about 40 years ago. She has a 35.00 pack-year smoking history. She has never used smokeless tobacco. She reports current drug use. Drug: Marijuana. She reports that she does not drink alcohol.   Family History  Problem Relation Age of Onset   Heart disease Mother        before age 40   Heart disease Sister        before age 52   Heart disease Brother        before age 74     Allergies  Allergen Reactions   No Known Allergies      Prior to Admission medications   Medication Sig Start Date End Date Taking? Authorizing Provider  acetaminophen (TYLENOL) 500 MG tablet Take 1,000 mg  by mouth every 6 (six) hours as needed (for pain).    [provider]  albuterol (PROVENTIL HFA;VENTOLIN HFA) 108 (90 Base) MCG/ACT inhaler Inhale into the lungs every 6 (six) hours as needed for wheezing or shortness of breath.    [provider]  amLODipine (NORVASC) 5 MG tablet  10/30/17   [provider]  aspirin 81 MG tablet Take 81 mg by mouth daily.    [provider]  B Complex-C (SUPER B COMPLEX PO) Take 1 tablet by mouth daily.    [provider]  cilostazol (PLETAL) 100 MG tablet Take 100 mg by mouth 2 (two) times  daily.    [provider]  Fluticasone-Salmeterol (ADVAIR DISKUS) 100-50 MCG/DOSE AEPB Inhale 1 puff into the lungs 2 (two) times daily.    [provider]  HYDROcodone-acetaminophen (NORCO/VICODIN) 5-325 MG tablet  10/20/17   [provider]  ibuprofen (ADVIL,MOTRIN) 800 MG tablet  10/20/17   [provider]  ipratropium-albuterol (DUONEB) 0.5-2.5 (3) MG/3ML SOLN Take 3 mLs by nebulization every 6 (six) hours as needed (for shortness of breath/wheezing/congestion).    [provider]  metFORMIN (GLUCOPHAGE) 500 MG tablet Take 500 mg by mouth 2 (two) times daily. 04/24/16   [provider]  oxyCODONE-acetaminophen (ROXICET) 5-325 MG tablet Take 1 tablet by mouth every 6 (six) hours as needed for severe pain. Patient not taking: Reported on 05/17/2018 09/03/16   Dara Lords, PA-C  simvastatin (ZOCOR) 20 MG tablet Take 20 mg by mouth daily. 04/24/16   [provider]    Review of Systems:  Constitutional:  No weight loss, night sweats, Fevers, chills, fatigue.  Head&Eyes: No headache.  No vision loss.  No eye pain or scotoma ENT:  No Difficulty swallowing,Tooth/dental problems,Sore throat,  No ear ache, post nasal drip,  Cardio-vascular:  No chest pain, Orthopnea, PND, swelling in lower extremities,  dizziness, palpitations  GI:  No  abdominal pain, nausea, vomiting, diarrhea, loss of appetite, hematochezia, melena, heartburn, indigestion, Resp:  No coughing up of blood .No wheezing.No chest wall deformity  Skin:  no rash or lesions.  GU:  no dysuria, change in color of urine, no urgency or frequency. No flank pain.  Musculoskeletal:  No joint pain or swelling. No decreased range of motion. No back pain.  Psych:  No change in mood or affect. No depression or anxiety. Neurologic: No headache, no dysesthesia, no focal weakness, no vision loss. No syncope  Physical Exam: Vitals:   05/12/21 1138 05/12/21 1139  BP:  (!)  141/64  Pulse:  91  Resp:  17  Temp:  97.7 F (36.5 C)  TempSrc:  Oral  SpO2:  98%  Weight: 72.1 kg   Height: 5\' 7"  (1.702 m)    General:  A&O x 3, NAD, nontoxic, pleasant/cooperative Head/Eye: No conjunctival hemorrhage, no icterus, [place]/AT, No nystagmus ENT:  No icterus,  No thrush, good dentition, no pharyngeal exudate Neck:  No masses, no lymphadenpathy, no bruits CV:  RRR, no rub, no gallop, no S3 Lung:  CTAB, good air movement, no wheeze, no rhonchi Abdomen: soft/NT, +BS, nondistended, no peritoneal signs Ext: No cyanosis, No rashes, No petechiae, No lymphangitis, No edema Neuro: CNII-XII intact, strength 4/5 in bilateral upper and lower extremities, no dysmetria  Labs on Admission:  Basic Metabolic Panel: Recent Labs  Lab 05/12/21 1245  NA 137  K 2.5*  CL 108  CO2 19*  GLUCOSE 86  BUN 20  CREATININE 1.22*  CALCIUM  8.7*   Liver Function Tests: Recent Labs  Lab 05/12/21 1245  AST 11*  ALT 11  ALKPHOS 71  BILITOT 0.3  PROT 7.3  ALBUMIN 3.8   No results for input(s): LIPASE, AMYLASE in the last 168 hours. No results for input(s): AMMONIA in the last 168 hours. CBC: Recent Labs  Lab 05/12/21 1245  WBC 11.2*  NEUTROABS 6.8  HGB 6.8*  HCT 21.8*  MCV 79.0*  PLT 451*   Coagulation Profile: No results for input(s): INR, PROTIME in the last 168 hours. Cardiac Enzymes: No results for input(s): CKTOTAL, CKMB, CKMBINDEX, TROPONINI in the last 168 hours. BNP: Invalid input(s): POCBNP CBG: No results for input(s): GLUCAP in the last 168 hours. Urine analysis:    Component Value Date/Time   COLORURINE YELLOW 08/07/2016 1024   APPEARANCEUR CLEAR 08/07/2016 1024   LABSPEC 1.012 08/07/2016 1024   PHURINE 7.0 08/07/2016 1024   GLUCOSEU NEGATIVE 08/07/2016 1024   HGBUR NEGATIVE 08/07/2016 1024   BILIRUBINUR NEGATIVE 08/07/2016 1024   KETONESUR NEGATIVE 08/07/2016 1024   PROTEINUR NEGATIVE 08/07/2016 1024   NITRITE NEGATIVE 08/07/2016 1024   LEUKOCYTESUR  NEGATIVE 08/07/2016 1024   Sepsis Labs: @LABRCNTIP (procalcitonin:4,lacticidven:4) )No results found for this or any previous visit (from the past 240 hour(s)).   Radiological Exams on Admission: DG Chest Portable 1 View  Result Date: 05/12/2021 CLINICAL DATA:  Dyspnea, feeling of panic attack EXAM: PORTABLE CHEST 1 VIEW COMPARISON:  Chest radiograph 03/13/2021 FINDINGS: The cardiomediastinal silhouette is normal. The lungs are clear, with no focal consolidation or pulmonary edema. There is no pleural effusion or pneumothorax. There is no acute osseous abnormality. IMPRESSION: No radiographic evidence of acute cardiopulmonary process. Electronically Signed   By: 03/15/2021 M.D.   On: 05/12/2021 13:21    EKG: Independently reviewed. Sinus, no STT change    Time spent:60 minutes Code Status:   FULL Family Communication:  No Family at bedside Disposition Plan: expect 2 day hospitalization Consults called: GI DVT Prophylaxis: SCDs  05/14/2021, DO  [facility] Pager [phone]  If 7PM-7AM, please contact night-coverage [email or web address] Password [email or web address] 05/12/2021, 3:05 PM

## 2021-05-12 NOTE — ED Provider Notes (Signed)
[facility] EMERGENCY DEPARTMENT Provider Note   CSN: [id number] Arrival date & time: 05/12/21  1131     History Chief Complaint  Patient presents with   Panic Attack    Andrea Horton is a 70 y.o. female.  HPI 70 year old female presents with concern for a panic attack.  She states that she has been dealing with this for years but is particularly worse today.  Its been ongoing since last night.  Sister at the bedside endorses that this happens frequently.  She will feel all over weak, become short of breath and panicky.  Typically walking around or going inside or outside to get fresh air seems to help.  However today it seems worse.  She denies any new cough though she has a chronic cough from COPD.  There is no chest pain or tightness.  No focal weakness or headaches.  Past Medical History:  Diagnosis Date   Asthma    Bronchitis    COPD (chronic obstructive pulmonary disease) (HCC)    Diabetes mellitus without complication (HCC)    GERD (gastroesophageal reflux disease)    History of kidney stones    Hypertension     Patient Active Problem List   Diagnosis Date Noted   Acute blood loss anemia 05/12/2021   Asymptomatic carotid artery stenosis, left 09/02/2016    Past Surgical History:  Procedure Laterality Date   ABDOMINAL HYSTERECTOMY     partial   ENDARTERECTOMY Left 09/02/2016   Procedure: ENDARTERECTOMY CAROTID LEFT;  Surgeon: Sherren Kerns, MD;  Location: [facility] OR;  Service: Vascular;  Laterality: Left;   PATCH ANGIOPLASTY Left 09/02/2016   Procedure: PATCH ANGIOPLASTY LEFT CAROTID ARTERY;  Surgeon: Sherren Kerns, MD;  Location: [facility] OR;  Service: Vascular;  Laterality: Left;   TUBAL LIGATION       OB History   No obstetric history on file.     Family History  Problem Relation Age of Onset   Heart disease Mother        before age 32   Heart disease Sister        before age 79   Heart disease Brother        before age 57    Social History    Tobacco Use   Smoking status: Every Day    Packs/day: 1.00    Years: 35.00    Pack years: 35.00    Types: Cigarettes    Start date: 04/04/1981   Smokeless tobacco: Never  Substance Use Topics   Alcohol use: No   Drug use: Yes    Types: Marijuana    Comment: smoked Crack 20 yers ago, will smoke marijuana every once in a while    Home Medications Prior to Admission medications   Medication Sig Start Date End Date Taking? Authorizing Provider  acetaminophen (TYLENOL) 500 MG tablet Take 1,000 mg by mouth every 6 (six) hours as needed (for pain).   Yes [provider]  albuterol (PROVENTIL HFA;VENTOLIN HFA) 108 (90 Base) MCG/ACT inhaler Inhale into the lungs every 6 (six) hours as needed for wheezing or shortness of breath.   Yes [provider]  amLODipine (NORVASC) 5 MG tablet Take 5 mg by mouth daily. 10/30/17  Yes [provider]  aspirin 81 MG tablet Take 81 mg by mouth daily.   Yes [provider]  cilostazol (PLETAL) 100 MG tablet Take 100 mg by mouth 2 (two) times daily.   Yes [provider]  fluticasone Aleda Grana)  50 MCG/ACT nasal spray Place 2 sprays into both nostrils daily. 02/26/21  Yes [provider]  Fluticasone-Salmeterol (ADVAIR) 100-50 MCG/DOSE AEPB Inhale 1 puff into the lungs 2 (two) times daily.   Yes [provider]  Fluticasone-Umeclidin-Vilant (TRELEGY ELLIPTA) 100-62.5-25 MCG/ACT AEPB Inhale 1 puff into the lungs daily. 04/17/21  Yes [provider]  ipratropium-albuterol (DUONEB) 0.5-2.5 (3) MG/3ML SOLN Take 3 mLs by nebulization every 6 (six) hours as needed (for shortness of breath/wheezing/congestion).   Yes [provider]  metFORMIN (GLUCOPHAGE) 500 MG tablet Take 500 mg by mouth 2 (two) times daily. 04/24/16  Yes [provider]  simvastatin (ZOCOR) 20 MG tablet Take 20 mg by mouth daily. 04/24/16  Yes [provider]  HYDROcodone-acetaminophen (NORCO/VICODIN)  5-325 MG tablet  10/20/17   [provider]  ibuprofen (ADVIL,MOTRIN) 800 MG tablet  10/20/17   [provider]  oxyCODONE-acetaminophen (ROXICET) 5-325 MG tablet Take 1 tablet by mouth every 6 (six) hours as needed for severe pain. Patient not taking: Reported on 05/17/2018 09/03/16   Dara Lords, PA-C    Allergies    No known allergies  Review of Systems   Review of Systems  Constitutional:  Negative for fever.  Respiratory:  Positive for shortness of breath. Negative for cough.   Cardiovascular:  Negative for chest pain.  Neurological:  Negative for headaches.  Psychiatric/Behavioral:  The patient is nervous/anxious.   All other systems reviewed and are negative.  Physical Exam Updated Vital Signs BP (!) 141/64 (BP Location: Right Arm)   Pulse 91   Temp 97.7 F (36.5 C) (Oral)   Resp 17   Ht 5\' 7"  (1.702 m)   Wt 72.1 kg   SpO2 98%   BMI 24.90 kg/m   Physical Exam Vitals and nursing note reviewed.  Constitutional:      General: She is not in acute distress.    Appearance: She is well-developed. She is not ill-appearing or diaphoretic.  HENT:     Head: Normocephalic and atraumatic.     Right Ear: External ear normal.     Left Ear: External ear normal.     Nose: Nose normal.  Eyes:     General:        Right eye: No discharge.        Left eye: No discharge.     Pupils: Pupils are equal, round, and reactive to light.  Cardiovascular:     Rate and Rhythm: Normal rate and regular rhythm.     Heart sounds: Normal heart sounds.  Pulmonary:     Effort: Pulmonary effort is normal.     Breath sounds: Normal breath sounds.  Abdominal:     Palpations: Abdomen is soft.     Tenderness: There is no abdominal tenderness.  Skin:    General: Skin is warm and dry.  Neurological:     Mental Status: She is alert.     Comments: CN 3-12 grossly intact. 5/5 strength in all 4 extremities. Grossly normal sensation. Normal finger to nose.   Psychiatric:         Mood and Affect: Mood is anxious.    ED Results / Procedures / Treatments   Labs (all labs ordered are listed, but only abnormal results are displayed) Labs Reviewed  COMPREHENSIVE METABOLIC PANEL - Abnormal; Notable for the following components:      Result Value   Potassium 2.5 (*)    CO2 19 (*)    Creatinine, Ser 1.22 (*)  Calcium 8.7 (*)    AST 11 (*)    GFR, Estimated 48 (*)    All other components within normal limits  CBC WITH DIFFERENTIAL/PLATELET - Abnormal; Notable for the following components:   WBC 11.2 (*)    RBC 2.76 (*)    Hemoglobin 6.8 (*)    HCT 21.8 (*)    MCV 79.0 (*)    MCH 24.6 (*)    Platelets 451 (*)    All other components within normal limits  RETICULOCYTES - Abnormal; Notable for the following components:   RBC. 2.94 (*)    Immature Retic Fract 27.0 (*)    All other components within normal limits  POC OCCULT BLOOD, ED - Abnormal; Notable for the following components:   Fecal Occult Bld POSITIVE (*)    All other components within normal limits  RESP PANEL BY RT-PCR (FLU A&B, COVID) ARPGX2  MAGNESIUM  VITAMIN B12  FOLATE  IRON AND TIBC  FERRITIN  LACTIC ACID, PLASMA  TSH  T4, FREE  HEMOGLOBIN A1C  RAPID URINE DRUG SCREEN, HOSP PERFORMED  TYPE AND SCREEN  PREPARE RBC (CROSSMATCH)  TROPONIN I (HIGH SENSITIVITY)  TROPONIN I (HIGH SENSITIVITY)    EKG EKG Interpretation  Date/Time:  Monday May 12 2021 11:49:38 EDT Ventricular Rate:  90 PR Interval:  160 QRS Duration: 98 QT Interval:  418 QTC Calculation: 511 R Axis:   55 Text Interpretation: Sinus rhythm with occasional Premature ventricular complexes Prolonged QT Abnormal ECG Confirmed by Pricilla Loveless [phone]) on 05/12/2021 11:54:09 AM  Radiology DG Chest Portable 1 View  Result Date: 05/12/2021 CLINICAL DATA:  Dyspnea, feeling of panic attack EXAM: PORTABLE CHEST 1 VIEW COMPARISON:  Chest radiograph 03/13/2021 FINDINGS: The cardiomediastinal silhouette is normal. The  lungs are clear, with no focal consolidation or pulmonary edema. There is no pleural effusion or pneumothorax. There is no acute osseous abnormality. IMPRESSION: No radiographic evidence of acute cardiopulmonary process. Electronically Signed   By: Lesia Hausen M.D.   On: 05/12/2021 13:21    Procedures .Critical Care Performed by: Pricilla Loveless, MD Authorized by: Pricilla Loveless, MD   Critical care provider statement:    Critical care time (minutes):  40   Critical care time was exclusive of:  Separately billable procedures and treating other patients   Critical care was necessary to treat or prevent imminent or life-threatening deterioration of the following conditions:  Circulatory failure   Critical care was time spent personally by me on the following activities:  Development of treatment plan with patient or surrogate, discussions with consultants, evaluation of patient's response to treatment, obtaining history from patient or surrogate, examination of patient, ordering and performing treatments and interventions, ordering and review of laboratory studies, pulse oximetry, ordering and review of radiographic studies and re-evaluation of patient's condition   Medications Ordered in ED Medications  potassium chloride 10 mEq in 100 mL IVPB (10 mEq Intravenous New Bag/Given 05/12/21 1441)  0.9 %  sodium chloride infusion (has no administration in time range)  LORazepam (ATIVAN) tablet 1 mg (1 mg Oral Given 05/12/21 1302)  potassium chloride SA (KLOR-CON) CR tablet 40 mEq (40 mEq Oral Given 05/12/21 1452)  pantoprazole (PROTONIX) injection 40 mg (40 mg Intravenous Given 05/12/21 1443)    ED Course  I have reviewed the triage vital signs and the nursing notes.  Pertinent labs & imaging results that were available during my care of the patient were reviewed by me and considered in my medical decision making (see chart  for details).    MDM Rules/Calculators/A&P                            Patient vital signs are stable besides some hypertension.  While she endorses this seems like a panic attack, I said that given her age and no previous diagnosis of this I think labs would be warranted.  Labs show a new anemia of 6.8.  She does have dark stools that are heme positive and I am concerned about subacute GI bleed.  Given IV Protonix and blood transfusion.  Also found to be hypokalemic, worse than typical and she will be repleted with potassium.  Will need admission. Final Clinical Impression(s) / ED Diagnoses Final diagnoses:  Symptomatic anemia  Hypokalemia    Rx / DC Orders ED Discharge Orders     None        Pricilla Loveless, MD 05/12/21 [phone]

## 2021-05-13 ENCOUNTER — Observation Stay (HOSPITAL_COMMUNITY): Payer: Medicare Other | Admitting: Anesthesiology

## 2021-05-13 ENCOUNTER — Encounter (HOSPITAL_COMMUNITY)
Admission: EM | Disposition: A | Discharge status: Home / Self Care | Payer: Self-pay | Source: Home / Self Care | Attending: Emergency Medicine

## 2021-05-13 ENCOUNTER — Telehealth: Payer: Self-pay | Admitting: Gastroenterology

## 2021-05-13 DIAGNOSIS — D649 Anemia, unspecified: Secondary | ICD-10-CM

## 2021-05-13 DIAGNOSIS — R195 Other fecal abnormalities: Secondary | ICD-10-CM

## 2021-05-13 DIAGNOSIS — E876 Hypokalemia: Secondary | ICD-10-CM | POA: Diagnosis not present

## 2021-05-13 DIAGNOSIS — I10 Essential (primary) hypertension: Secondary | ICD-10-CM | POA: Diagnosis not present

## 2021-05-13 DIAGNOSIS — K31819 Angiodysplasia of stomach and duodenum without bleeding: Secondary | ICD-10-CM | POA: Diagnosis not present

## 2021-05-13 DIAGNOSIS — Z20822 Contact with and (suspected) exposure to covid-19: Secondary | ICD-10-CM | POA: Diagnosis not present

## 2021-05-13 DIAGNOSIS — D509 Iron deficiency anemia, unspecified: Secondary | ICD-10-CM

## 2021-05-13 DIAGNOSIS — K317 Polyp of stomach and duodenum: Secondary | ICD-10-CM | POA: Diagnosis not present

## 2021-05-13 DIAGNOSIS — D62 Acute posthemorrhagic anemia: Secondary | ICD-10-CM | POA: Diagnosis not present

## 2021-05-13 HISTORY — PX: ESOPHAGOGASTRODUODENOSCOPY (EGD) WITH PROPOFOL: SHX5813

## 2021-05-13 HISTORY — PX: HOT HEMOSTASIS: SHX5433

## 2021-05-13 HISTORY — PX: POLYPECTOMY: SHX5525

## 2021-05-13 LAB — TYPE AND SCREEN
ABO/RH(D): AB POS
Antibody Screen: NEGATIVE
Unit division: 0

## 2021-05-13 LAB — BASIC METABOLIC PANEL
Anion gap: 10 (ref 5–15)
BUN: 13 mg/dL (ref 8–23)
CO2: 23 mmol/L (ref 22–32)
Calcium: 8.5 mg/dL — ABNORMAL LOW (ref 8.9–10.3)
Chloride: 107 mmol/L (ref 98–111)
Creatinine, Ser: 0.71 mg/dL (ref 0.44–1.00)
GFR, Estimated: >60 mL/min (ref >60)
Glucose, Bld: 108 mg/dL — ABNORMAL HIGH (ref 70–99)
Potassium: 3 mmol/L — ABNORMAL LOW (ref 3.5–5.1)
Sodium: 140 mmol/L (ref 135–145)

## 2021-05-13 LAB — CBC
HCT: 26.1 % — ABNORMAL LOW (ref 36.0–46.0)
Hemoglobin: 8.3 g/dL — ABNORMAL LOW (ref 12.0–15.0)
MCH: 25.4 pg — ABNORMAL LOW (ref 26.0–34.0)
MCHC: 31.8 g/dL (ref 30.0–36.0)
MCV: 79.8 fL — ABNORMAL LOW (ref 80.0–100.0)
Platelets: 407 10*3/uL — ABNORMAL HIGH (ref 150–400)
RBC: 3.27 MIL/uL — ABNORMAL LOW (ref 3.87–5.11)
RDW: 15.8 % — ABNORMAL HIGH (ref 11.5–15.5)
WBC: 8.7 10*3/uL (ref 4.0–10.5)
nRBC: 0 % (ref 0.0–0.2)

## 2021-05-13 LAB — BPAM RBC
Blood Product Expiration Date: 202211272359
ISSUE DATE / TIME: 202210241750
Unit Type and Rh: 6200

## 2021-05-13 LAB — HEMOGLOBIN A1C
Hgb A1c MFr Bld: 6.3 % — ABNORMAL HIGH (ref 4.8–5.6)
Mean Plasma Glucose: 134 mg/dL

## 2021-05-13 LAB — HIV ANTIBODY (ROUTINE TESTING W REFLEX): HIV Screen 4th Generation wRfx: NONREACTIVE

## 2021-05-13 SURGERY — ESOPHAGOGASTRODUODENOSCOPY (EGD) WITH PROPOFOL
Anesthesia: General

## 2021-05-13 MED ORDER — FERROUS SULFATE 325 (65 FE) MG PO TABS: 325.0000 mg | ORAL_TABLET | Freq: Two times a day (BID) | ORAL | Status: DC

## 2021-05-13 MED ORDER — LACTATED RINGERS IV SOLN: INTRAVENOUS | Status: DC

## 2021-05-13 MED ORDER — ASCORBIC ACID 500 MG PO TABS: 500.0000 mg | ORAL_TABLET | Freq: Two times a day (BID) | ORAL | Status: AC

## 2021-05-13 MED ORDER — ASCORBIC ACID 500 MG PO TABS: 500.0000 mg | ORAL_TABLET | Freq: Two times a day (BID) | ORAL | Status: DC

## 2021-05-13 MED ORDER — FERROUS SULFATE 325 (65 FE) MG PO TABS: 325.0000 mg | ORAL_TABLET | Freq: Two times a day (BID) | ORAL | 3 refills | Status: AC

## 2021-05-13 MED ORDER — PROPOFOL 10 MG/ML IV BOLUS
INTRAVENOUS | Status: DC | PRN
  Administered 2021-05-13: 80 mg via INTRAVENOUS
  Administered 2021-05-13: 100 ug/kg/min via INTRAVENOUS

## 2021-05-13 MED ORDER — MIDAZOLAM HCL 2 MG/2ML IJ SOLN: 2.0000 mg | Freq: Once | INTRAMUSCULAR | Status: AC

## 2021-05-13 MED ORDER — POTASSIUM CHLORIDE 10 MEQ/100ML IV SOLN
10.0000 meq | INTRAVENOUS | Status: AC
  Administered 2021-05-13 (×4): 10 meq via INTRAVENOUS
  Filled 2021-05-13: qty 100

## 2021-05-13 MED ORDER — SODIUM CHLORIDE 0.9 % IV SOLN: INTRAVENOUS | Status: DC

## 2021-05-13 MED ORDER — METOPROLOL TARTRATE 5 MG/5ML IV SOLN
INTRAVENOUS | Status: DC | PRN
  Administered 2021-05-13: 1 mg via INTRAVENOUS

## 2021-05-13 MED ORDER — MIDAZOLAM HCL 2 MG/2ML IJ SOLN
INTRAMUSCULAR | Status: AC
  Administered 2021-05-13: 2 mg via INTRAVENOUS
  Filled 2021-05-13: qty 2

## 2021-05-13 MED ORDER — LIDOCAINE HCL (CARDIAC) PF 100 MG/5ML IV SOSY
PREFILLED_SYRINGE | INTRAVENOUS | Status: DC | PRN
  Administered 2021-05-13: 50 mg via INTRAVENOUS

## 2021-05-13 NOTE — Anesthesia Postprocedure Evaluation (Signed)
Anesthesia Post Note  Patient: Sammi L Salay  Procedure(s) Performed: ESOPHAGOGASTRODUODENOSCOPY (EGD) WITH PROPOFOL POLYPECTOMY HOT HEMOSTASIS (ARGON PLASMA COAGULATION/BICAP)  Patient location during evaluation: Phase II Anesthesia Type: General Level of consciousness: awake and alert and oriented Pain management: pain level controlled Vital Signs Assessment: post-procedure vital signs reviewed and stable Respiratory status: spontaneous breathing, nonlabored ventilation and respiratory function stable Cardiovascular status: blood pressure returned to baseline and stable Postop Assessment: no apparent nausea or vomiting Anesthetic complications: no   No notable events documented.   Last Vitals:  Vitals:   05/13/21 1454 05/13/21 1528  BP: (!) 157/68 (!) 164/75  Pulse: 80 88  Resp:  16  Temp:  (!) 36.2 C  SpO2: 100% 100%    Last Pain:  Vitals:   05/13/21 1445  TempSrc:   PainSc: 0-No pain                 Loyde Orth C Brynnlie Unterreiner

## 2021-05-13 NOTE — Brief Op Note (Signed)
05/12/2021 - 05/13/2021  2:21 PM  PATIENT:  Andrea Horton  70 y.o. female  PRE-OPERATIVE DIAGNOSIS:  Symptomatic iron deficiency anemia, heme positive stool  POST-OPERATIVE DIAGNOSIS:  small bowel polyp (CS) not retrieved; AVM x2 w/APC   PROCEDURE:  Procedure(s): ESOPHAGOGASTRODUODENOSCOPY (EGD) WITH PROPOFOL (N/A) POLYPECTOMY HOT HEMOSTASIS (ARGON PLASMA COAGULATION/BICAP)  SURGEON:  Surgeon(s) and Role:    * Dolores Frame, MD - Primary  Patient underwent EGD under propofol sedation.  Tolerated the procedure adequately.  Esophagus and stomach were within normal limits.  Two diminutive angiodysplastic lesions without bleeding were found in the duodenal bulb and in the third portion of the duodenum. Coagulation for bleeding prevention using argon plasma at 0.3 liters/minute and 20 watts was successful.  There was presence of a diminutive polyp in the duodenum which was resected but could not be retrieved.  RECOMMENDATIONS: - Return patient to hospital ward for ongoing care.  - Resume previous diet.  - Schedule colonoscopy as outpatient in next available. - Follow up in GI clinic in 3-4 weeks. - GI service will sign-off, please call us back if you have any more questions.  Katrinka Blazing, MD Gastroenterology and Hepatology [facility]

## 2021-05-13 NOTE — Progress Notes (Signed)
    Subjective: Feeling well this morning. Had entire clear liquid tray for breakfast this morning despite n.p.o. order.  Denies nausea, vomiting, GERD symptoms, dysphagia, abdominal pain, BRBPR, melena.  Reports she last took ibuprofen a couple months ago for eye pain.  Chronically on 81 mg aspirin.  No other NSAIDs.  Objective: Vital signs in last 24 hours: Temp:  [97.7 F (36.5 C)-98.7 F (37.1 C)] 98.1 F (36.7 C) (10/25 0539) Pulse Rate:  [72-92] 86 (10/25 0539) Resp:  [14-20] 14 (10/25 0539) BP: (133-167)/(56-81) 138/68 (10/25 0539) SpO2:  [97 %-100 %] 97 % (10/25 0539) Weight:  [63.6 kg-72.1 kg] 63.6 kg (10/24 1658) Last BM Date: 05/11/21 General:   Alert and oriented, pleasant Head:  Normocephalic and atraumatic. Eyes:  No icterus, sclera clear. Conjuctiva pink.  Abdomen:  Bowel sounds present, soft, non-tender, non-distended. No HSM or hernias noted. No rebound or guarding. No masses appreciated  Msk:  Symmetrical without gross deformities. Normal posture. Extremities:  Without edema. Neurologic:  Alert and  oriented x4;  grossly normal neurologically. Skin:  Warm and dry, intact without significant lesions.  Psych:  Normal mood and affect.  Intake/Output from previous day: 10/24 0701 - 10/25 0700 In: 854.8 [I.V.:364.8; Blood:390; IV Piggyback:100] Out: -  Intake/Output this shift: No intake/output data recorded.  Lab Results: Recent Labs    05/12/21 1245 05/13/21 0535  WBC 11.2* 8.7  HGB 6.8* 8.3*  HCT 21.8* 26.1*  PLT 451* 407*   BMET Recent Labs    05/12/21 1245 05/13/21 0535  NA 137 140  K 2.5* 3.0*  CL 108 107  CO2 19* 23  GLUCOSE 86 108*  BUN 20 13  CREATININE 1.22* 0.71  CALCIUM 8.7* 8.5*   LFT Recent Labs    05/12/21 1245  PROT 7.3  ALBUMIN 3.8  AST 11*  ALT 11  ALKPHOS 71  BILITOT 0.3    Assessment: 70 y.o. female admitted with symptomatic acute blood loss anemia with iron deficiency, heme positive stool, without overt GI  bleeding.  Hemoglobin 6.8 on admission, ferritin 4, iron 11, saturation 3%, B12 and folate within normal limits. BUN within normal limits. S/p 1 unit PRBCs with hemoglobin improved to 8.3 today.  Last hemoglobin prior to admission August 2022 was 10.7.   Regarding her GI symptoms, she reported vague nausea with rare vomiting prior to admission. No other significant symptoms.  No prior colonoscopy/EGD. No family history of colorectal cancer or polyps. She endorses occasion ibuprofen use, but non in a couple of months. Takes 81 mg aspirin daily.   We had plan for EGD today.  Patient had a tray of clear liquids this morning.  Discussed with Dr. Levon Hedger.  We will keep her n.p.o. for the rest of the day and plan for EGD this afternoon around 2 PM.  Hypokalemia: Improved to 3.0 today from 2.5 yesterday. Replacement per hospitalist.  4 doses of IV potassium 10 mg has been ordered this morning.  Plan: Keep NPO. EGD with propofol with Dr. Levon Hedger today around 2 PM. The risks, benefits, and alternatives have been discussed with the patient in detail. The patient states understanding and desires to proceed.  Potassium replacement per hospitalist. Continue empiric PPI twice daily. Continue to monitor H/H and for overt GI bleeding.  Will need oral iron daily at discharge.    LOS: 0 days    05/13/2021, 7:27 AM   Ermalinda Memos, PA-C [facility]

## 2021-05-13 NOTE — Care Management Obs Status (Signed)
MEDICARE OBSERVATION STATUS NOTIFICATION   Patient Details  Name: Andrea Horton MRN: [id number] Date of Birth: 06/21/1951   Medicare Observation Status Notification Given:  Yes    Corey Harold 05/13/2021, 5:25 PM

## 2021-05-13 NOTE — Anesthesia Preprocedure Evaluation (Signed)
Anesthesia Evaluation  Patient identified by MRN, date of birth, ID band Patient awake    Reviewed: Allergy & Precautions, NPO status , Patient's Chart, lab work & pertinent test results  Airway Mallampati: II  TM Distance: >3 FB Neck ROM: Full    Dental  (+) Missing   Pulmonary asthma , COPD,  COPD inhaler, Current Smoker and Patient abstained from smoking.,    Pulmonary exam normal breath sounds clear to auscultation       Cardiovascular Exercise Tolerance: Good hypertension, Pt. on medications  Rhythm:Regular Rate:Tachycardia  Left ventricle: The cavity size was normal. Wall thickness was increased in a pattern of mild LVH. Systolic function was normal.  The estimated ejection fraction was in the range of 60% to 65%. Wall motion was normal; there were no regional wall motion abnormalities. Doppler parameters are consistent with abnormal  left ventricular relaxation (grade 1 diastolic dysfunction).  - Aortic valve: Mildly calcified annulus. Trileaflet; mildly  calcified leaflets.  - Right atrium: Central venous pressure (est): 3 mm Hg.  - Atrial septum: No defect or patent foramen ovale was identified.  - Tricuspid valve: There was trivial regurgitation.  - Pulmonary arteries: PA peak pressure: 10 mm Hg (S).  - Pericardium, extracardiac: There was no pericardial effusion   Neuro/Psych PSYCHIATRIC DISORDERS (panic attacks) Anxiety negative neurological ROS     GI/Hepatic Neg liver ROS, GERD  ,  Endo/Other  negative endocrine ROSdiabetes, Well Controlled, Type 2, Oral Hypoglycemic Agents  Renal/GU negative Renal ROS  negative genitourinary   Musculoskeletal negative musculoskeletal ROS (+)   Abdominal   Peds negative pediatric ROS (+)  Hematology negative hematology ROS (+) anemia ,   Anesthesia Other Findings   Reproductive/Obstetrics negative OB ROS                              Anesthesia Physical Anesthesia Plan  ASA: 3  Anesthesia Plan: General   Post-op Pain Management:    Induction: Intravenous  PONV Risk Score and Plan: TIVA  Airway Management Planned: Nasal Cannula and Natural Airway  Additional Equipment:   Intra-op Plan:   Post-operative Plan:   Informed Consent: I have reviewed the patients History and Physical, chart, labs and discussed the procedure including the risks, benefits and alternatives for the proposed anesthesia with the patient or authorized representative who has indicated his/her understanding and acceptance.     Dental advisory given  Plan Discussed with: CRNA and Surgeon  Anesthesia Plan Comments:         Anesthesia Quick Evaluation

## 2021-05-13 NOTE — Progress Notes (Signed)
Patient arrived to endo via st very anxious feels like she is having a panic attack and wants to go back to her room.  Dr Levon Hedger in to see patient. Patient OK to have procedure. Dr Alva Garnet in to see patient.

## 2021-05-13 NOTE — Discharge Summary (Addendum)
Physician Discharge Summary  Andrea Horton BWI:[id number] DOB: Feb 24, 1951 DOA: 05/12/2021  PCP: Avon Gully, MD  Admit date: 05/12/2021 Discharge date: 05/13/2021  Admitted From: Home Disposition:  Home   Recommendations for Outpatient Follow-up:  Follow up with PCP in 1-2 weeks Please obtain BMP/CBC in one week    Discharge Condition: Stable CODE STATUS: FULL Diet recommendation: Heart Healthy/Carb modified   Brief/Interim Summary: 70 y.o. female with medical history of diabetes mellitus, hypertension, COPD presenting with a sensation of a "panic attack".  The patient states that her sensation of having a panic attack has become more frequent.  She states that she was feeling more anxious and having more shortness of breath with exertion.  Otherwise, the patient has some trouble describing what she is feeling.  She denies any fevers, chills, chest pain, headache, neck pain, nausea, vomiting, diarrhea, abdominal pain.  She does have a cough with white mucus.  She denies any hematochezia or melena.  She denies any NSAID usage.  She denies any illicit drug use or alcohol.  She states that she is not able to sleep.   ED In the emergency department, the patient was afebrile hemodynamically stable with oxygen saturation 98% on room air.  BMP showed sodium 137, potassium 2.5, CO2 19, serum creatinine 1.22.  LFTs were unremarkable.  WBC 11.2, hemoglobin 6.8, platelets 451,000.  Chest x-ray was negative for any infiltrates or edema.  EKG showed sinus rhythm with no ST-T wave changes.   FOBT was positive.  The patient was given potassium 40 p.o. and 10 IV.  Ativan 1 mg was given.  Discharge Diagnoses:      Symptomatic Anemia/FOBT+ -GI consult>>EGD on 05/13/21 -presented with Hgb 6.8 -baseline Hgb 10 -one unit PRBC transfused -clear liquids>>diet advanced which pt tolerated -iron saturation 3; ferritin 4 -B12 250 -folate 14.4 -Started Protonix -hold ASA>>restart after d/c -d/c  home with ferrous sulfate bid with vitamin C 520m bid -EGD 05/13/21--two nonbleeding AVMs in duodenum tx with APC -Hgb 8.3 on day of d/c   HTN -continue amlodipine   Diabetes Mellitus type 2  -hold metformin>>restart after d/c -ISS -A1C--6.3   Tobacco abuse -at least 30 pack year history -cessation discussed   COPD -stable on RA -continue Advair   Hyperlipidemia -continue statin   Hypokalemia -repleted    Allergies as of 05/13/2021       Reactions   No Known Allergies         Medication List     STOP taking these medications    oxyCODONE-acetaminophen 5-325 MG tablet Commonly known as: Roxicet       TAKE these medications    acetaminophen 500 MG tablet Commonly known as: TYLENOL Take 1,000 mg by mouth every 6 (six) hours as needed (for pain).   albuterol 108 (90 Base) MCG/ACT inhaler Commonly known as: VENTOLIN HFA Inhale into the lungs every 6 (six) hours as needed for wheezing or shortness of breath.   amLODipine 5 MG tablet Commonly known as: NORVASC Take 5 mg by mouth daily.   ascorbic acid 500 MG tablet Commonly known as: VITAMIN C Take 1 tablet (500 mg total) by mouth 2 (two) times daily before a meal.   aspirin 81 MG tablet Take 81 mg by mouth daily.   cilostazol 100 MG tablet Commonly known as: PLETAL Take 100 mg by mouth 2 (two) times daily.   ferrous sulfate 325 (65 FE) MG tablet Take 1 tablet (325 mg total) by mouth 2 (two) times daily  with a meal.   fluticasone 50 MCG/ACT nasal spray Commonly known as: FLONASE Place 2 sprays into both nostrils daily.   Fluticasone-Salmeterol 100-50 MCG/DOSE Aepb Commonly known as: ADVAIR Inhale 1 puff into the lungs 2 (two) times daily.   Fluticasone-Umeclidin-Vilant 100-62.5-25 MCG/ACT Aepb Inhale 1 puff into the lungs daily.   ipratropium-albuterol 0.5-2.5 (3) MG/3ML Soln Commonly known as: DUONEB Take 3 mLs by nebulization every 6 (six) hours as needed (for shortness of  breath/wheezing/congestion).   metFORMIN 500 MG tablet Commonly known as: GLUCOPHAGE Take 500 mg by mouth 2 (two) times daily.   simvastatin 20 MG tablet Commonly known as: ZOCOR Take 20 mg by mouth daily.        Allergies  Allergen Reactions   No Known Allergies     Consultations: GI   Procedures/Studies: DG Chest Portable 1 View  Result Date: 05/12/2021 CLINICAL DATA:  Dyspnea, feeling of panic attack EXAM: PORTABLE CHEST 1 VIEW COMPARISON:  Chest radiograph 03/13/2021 FINDINGS: The cardiomediastinal silhouette is normal. The lungs are clear, with no focal consolidation or pulmonary edema. There is no pleural effusion or pneumothorax. There is no acute osseous abnormality. IMPRESSION: No radiographic evidence of acute cardiopulmonary process. Electronically Signed   By: Lesia Hausen M.D.   On: 05/12/2021 13:21        Discharge Exam: Vitals:   05/13/21 1454 05/13/21 1528  BP: (!) 157/68 (!) 164/75  Pulse: 80 88  Resp:  16  Temp:  (!) 97.1 F (36.2 C)  SpO2: 100% 100%   Vitals:   05/13/21 1430 05/13/21 1445 05/13/21 1454 05/13/21 1528  BP: 109/63 136/70 (!) 157/68 (!) 164/75  Pulse: 92 80 80 88  Resp: 14 14  16   Temp:  97.8 F (36.6 C)  (!) 97.1 F (36.2 C)  TempSrc:      SpO2: 99% 100% 100% 100%  Weight:      Height:        General: Pt is alert, awake, not in acute distress Cardiovascular: RRR, S1/S2 +, no rubs, no gallops Respiratory: CTA bilaterally, no wheezing, no rhonchi Abdominal: Soft, NT, ND, bowel sounds + Extremities: no edema, no cyanosis   The results of significant diagnostics from this hospitalization (including imaging, microbiology, ancillary and laboratory) are listed below for reference.    Significant Diagnostic Studies: DG Chest Portable 1 View  Result Date: 05/12/2021 CLINICAL DATA:  Dyspnea, feeling of panic attack EXAM: PORTABLE CHEST 1 VIEW COMPARISON:  Chest radiograph 03/13/2021 FINDINGS: The cardiomediastinal silhouette  is normal. The lungs are clear, with no focal consolidation or pulmonary edema. There is no pleural effusion or pneumothorax. There is no acute osseous abnormality. IMPRESSION: No radiographic evidence of acute cardiopulmonary process. Electronically Signed   By: 03/15/2021 M.D.   On: 05/12/2021 13:21    Microbiology: Recent Results (from the past 240 hour(s))  Resp Panel by RT-PCR (Flu A&B, Covid) Nasopharyngeal Swab     Status: None   Collection Time: 05/12/21  2:29 PM   Specimen: Nasopharyngeal Swab; Nasopharyngeal(NP) swabs in vial transport medium  Result Value Ref Range Status   SARS Coronavirus 2 by RT PCR NEGATIVE NEGATIVE Final    Comment: (NOTE) SARS-CoV-2 target nucleic acids are NOT DETECTED.  The SARS-CoV-2 RNA is generally detectable in upper respiratory specimens during the acute phase of infection. The lowest concentration of SARS-CoV-2 viral copies this assay can detect is 138 copies/mL. A negative result does not preclude SARS-Cov-2 infection and should not be used as the sole  basis for treatment or other patient management decisions. A negative result may occur with  improper specimen collection/handling, submission of specimen other than nasopharyngeal swab, presence of viral mutation(s) within the areas targeted by this assay, and inadequate number of viral copies(<138 copies/mL). A negative result must be combined with clinical observations, patient history, and epidemiological information. The expected result is Negative.  Fact Sheet for Patients:  BloggerCourse.com  Fact Sheet for Healthcare Providers:  SeriousBroker.it  This test is no t yet approved or cleared by the [place] FDA and  has been authorized for detection and/or diagnosis of SARS-CoV-2 by FDA under an Emergency Use Authorization (EUA). This EUA will remain  in effect (meaning this test can be used) for the duration of the COVID-19  declaration under Section 564(b)(1) of the Act, 21 U.S.C.section 360bbb-3(b)(1), unless the authorization is terminated  or revoked sooner.       Influenza A by PCR NEGATIVE NEGATIVE Final   Influenza B by PCR NEGATIVE NEGATIVE Final    Comment: (NOTE) The Xpert Xpress SARS-CoV-2/FLU/RSV plus assay is intended as an aid in the diagnosis of influenza from Nasopharyngeal swab specimens and should not be used as a sole basis for treatment. Nasal washings and aspirates are unacceptable for Xpert Xpress SARS-CoV-2/FLU/RSV testing.  Fact Sheet for Patients: BloggerCourse.com  Fact Sheet for Healthcare Providers: SeriousBroker.it  This test is not yet approved or cleared by the [place] FDA and has been authorized for detection and/or diagnosis of SARS-CoV-2 by FDA under an Emergency Use Authorization (EUA). This EUA will remain in effect (meaning this test can be used) for the duration of the COVID-19 declaration under Section 564(b)(1) of the Act, 21 U.S.C. section 360bbb-3(b)(1), unless the authorization is terminated or revoked.  Performed at [facility], [address], [place], [place] [address]      Labs: Basic Metabolic Panel: Recent Labs  Lab 05/12/21 1245 05/12/21 1509 05/13/21 0535  NA 137  --  140  K 2.5*  --  3.0*  CL 108  --  107  CO2 19*  --  23  GLUCOSE 86  --  108*  BUN 20  --  13  CREATININE 1.22*  --  0.71  CALCIUM 8.7*  --  8.5*  MG  --  1.8  --    Liver Function Tests: Recent Labs  Lab 05/12/21 1245  AST 11*  ALT 11  ALKPHOS 71  BILITOT 0.3  PROT 7.3  ALBUMIN 3.8   No results for input(s): LIPASE, AMYLASE in the last 168 hours. No results for input(s): AMMONIA in the last 168 hours. CBC: Recent Labs  Lab 05/12/21 1245 05/13/21 0535  WBC 11.2* 8.7  NEUTROABS 6.8  --   HGB 6.8* 8.3*  HCT 21.8* 26.1*  MCV 79.0* 79.8*  PLT 451* 407*   Cardiac Enzymes: No results for input(s):  CKTOTAL, CKMB, CKMBINDEX, TROPONINI in the last 168 hours. BNP: Invalid input(s): POCBNP CBG: No results for input(s): GLUCAP in the last 168 hours.  Time coordinating discharge:  36 minutes  Signed:  Catarina Hartshorn, DO [facility] Pager: [phone] 05/13/2021, 5:08 PM

## 2021-05-13 NOTE — Telephone Encounter (Signed)
Patient is currently admitted for symptomatic anemia with heme positive stool, no overt GI bleeding.  She received 1 unit PRBCs this admission with hemoglobin improved to 8.3 today.  Underwent EGD today with normal esophagus, normal stomach, 2 nonbleeding angiodysplastic lesions in the duodenum treated with APC, single duodenal polyp with complete resection, polypoid tissue not retrieved.  GI has signed off.  Dr. Levon Hedger recommended scheduling colonoscopy outpatient, next available, and follow-up in the GI clinic in 3 to 4 weeks.  [facility] Clinical Pool:  Please arrange colonoscopy with propofol with Dr. Marletta Lor. ASA 3. Dx: IDA, heme positive stool  Medication adjustments: No iron x 7 days prior to procedure.   1 day prior to procedure: One half dose of metformin (250 mg twice daily). Day of procedure: No morning diabetes medications.   Courtney:  Please arrange H/H in 1 week. Dx: Anemia.   Stacey:  Please arrange hospital follow-up in 3-4 weeks with Dr. Marletta Lor.

## 2021-05-13 NOTE — Op Note (Signed)
[facility] Patient Name: Andrea Horton Procedure Date: 05/13/2021 1:31 PM MRN: [id number] Date of Birth: [birth year] Attending MD: Katrinka Blazing ,  CSN: [id number] Age: 70 Admit Type: Inpatient Procedure:                Upper GI endoscopy Indications:              Iron deficiency anemia Providers:                Katrinka Blazing, Crystal Page, Pandora Leiter,                            Technician Referring MD:              Medicines:                Monitored Anesthesia Care Complications:            No immediate complications. Estimated Blood Loss:     Estimated blood loss: none. Procedure:                Pre-Anesthesia Assessment:                           - Prior to the procedure, a History and Physical                            was performed, and patient medications, allergies                            and sensitivities were reviewed. The patient's                            tolerance of previous anesthesia was reviewed.                           - The risks and benefits of the procedure and the                            sedation options and risks were discussed with the                            patient. All questions were answered and informed                            consent was obtained.                           - ASA Grade Assessment: III - A patient with severe                            systemic disease.                           After obtaining informed consent, the endoscope was                            passed under direct vision. Throughout the  procedure, the patient's blood pressure, pulse, and                            oxygen saturations were monitored continuously. The                            [id number]-[id number] ([id number]) scope was introduced through the                            mouth, and advanced to the third part of duodenum.                            The upper GI endoscopy was accomplished without                             difficulty. The patient tolerated the procedure                            well. Scope In: 1:57:39 PM Scope Out: 2:10:23 PM Total Procedure Duration: 0 hours 12 minutes 44 seconds  Findings:      The examined esophagus was normal.      The entire examined stomach was normal.      Two diminutive angiodysplastic lesions without bleeding were found in       the duodenal bulb and in the third portion of the duodenum. Coagulation       for bleeding prevention using argon plasma at 0.3 liters/minute and 20       watts was successful.      A single 2 mm sessile polyp with no bleeding was found in the second       portion of the duodenum. The polyp was removed with a cold snare.       Resection was complete, but the polyp tissue was not retrieved. Impression:               - Normal esophagus.                           - Normal stomach.                           - Two non-bleeding angiodysplastic lesions in the                            duodenum. Treated with argon plasma coagulation                            (APC).                           - A single duodenal polyp. Complete resection.                            Polyp tissue not retrieved. Moderate Sedation:      Per Anesthesia Care Recommendation:           - Return patient to hospital ward for ongoing care.                           -  Resume previous diet.                           - Schedule colonsocopy as outpatient in next                            available.                           - Follow up in GI clinic in 3-4 weeks. Procedure Code(s):        --- Professional ---                           [phone], 59, Esophagogastroduodenoscopy, flexible,                            transoral; with control of bleeding, any method                           43251, Esophagogastroduodenoscopy, flexible,                            transoral; with removal of tumor(s), polyp(s), or                            other lesion(s) by snare  technique Diagnosis Code(s):        --- Professional ---                           K31.819, Angiodysplasia of stomach and duodenum                            without bleeding                           K31.7, Polyp of stomach and duodenum                           D50.9, Iron deficiency anemia, unspecified CPT copyright 2019 American Medical Association. All rights reserved. The codes documented in this report are preliminary and upon coder review may  be revised to meet current compliance requirements. Katrinka Blazing, MD Katrinka Blazing,  05/13/2021 2:21:26 PM This report has been signed electronically. Number of Addenda: 0

## 2021-05-13 NOTE — Transfer of Care (Signed)
Immediate Anesthesia Transfer of Care Note  Patient: Andrea Horton  Procedure(s) Performed: ESOPHAGOGASTRODUODENOSCOPY (EGD) WITH PROPOFOL POLYPECTOMY HOT HEMOSTASIS (ARGON PLASMA COAGULATION/BICAP)  Patient Location: PACU  Anesthesia Type:General  Level of Consciousness: awake, alert , patient cooperative and responds to stimulation  Airway & Oxygen Therapy: Patient Spontanous Breathing  Post-op Assessment: Report given to RN, Post -op Vital signs reviewed and stable and Patient moving all extremities X 4  Post vital signs: Reviewed and stable  Last Vitals:  Vitals Value Taken Time  BP    Temp    Pulse    Resp    SpO2      Last Pain:  Vitals:   05/13/21 0845  TempSrc:   PainSc: 0-No pain         Complications: No notable events documented.

## 2021-05-14 NOTE — Telephone Encounter (Signed)
LMOM for pt to call office back 

## 2021-05-15 ENCOUNTER — Other Ambulatory Visit: Payer: Self-pay | Admitting: *Deleted

## 2021-05-15 ENCOUNTER — Encounter: Payer: Self-pay | Admitting: *Deleted

## 2021-05-15 ENCOUNTER — Telehealth: Payer: Self-pay | Admitting: *Deleted

## 2021-05-15 DIAGNOSIS — D649 Anemia, unspecified: Secondary | ICD-10-CM

## 2021-05-15 NOTE — Telephone Encounter (Signed)
Spoke to pt. Informed her that she needs to have labs drawn and that I had mailed the lab requisition. Pt. Voiced understanding. Pt stated that she is feeling much better.

## 2021-05-15 NOTE — Telephone Encounter (Signed)
LMOM for pt to call office back. Will send her a letter.

## 2021-05-15 NOTE — Telephone Encounter (Signed)
Noted. Glad to hear she is feeling much better.

## 2021-05-19 ENCOUNTER — Encounter: Payer: Self-pay | Admitting: Internal Medicine

## 2021-05-22 ENCOUNTER — Encounter (HOSPITAL_COMMUNITY): Payer: Self-pay | Admitting: Gastroenterology

## 2021-05-25 NOTE — Telephone Encounter (Signed)
Routing back to [place] and [facility] Clinical Pool.   I see where patient was made aware to complete labs on separate telephone note on 10/27, but labs have not been completed. Please remind her of this.  Additionally, I do not see that any communication has been made about the need for colonoscopy. Toni Amend, please let patient know the recommendation about scheduling a colonoscopy. If she is agreeable, [facility] Clinical Pool, please arrange.  Please also make sure patient is aware of her follow-up appointment on 12/7.

## 2021-05-26 NOTE — Telephone Encounter (Addendum)
Tried to call pt. And her phone is not accepting call at this time and you can't leave a message. Will try back later.

## 2021-05-27 ENCOUNTER — Telehealth: Payer: Self-pay | Admitting: *Deleted

## 2021-05-27 NOTE — Telephone Encounter (Signed)
Spoke to pt. Informed her to have blood work done as soon as possible. She stated she would go tomorrow 11/9 she was waiting on a ride. Also reminded her of OV on 12/7. Asked pt about colonoscopy. She stated she would discuss at up coming OV.

## 2021-05-27 NOTE — Telephone Encounter (Signed)
noted 

## 2021-05-27 NOTE — Telephone Encounter (Signed)
Noted. [facility] Clinical Pool, FYI. This is in reference to telephone note 10/25.

## 2021-05-27 NOTE — Telephone Encounter (Signed)
Phone number we have for pt. Does not work. Spoke to pt's sister, asked her to please have pt call the office.

## 2021-05-28 ENCOUNTER — Encounter: Payer: Self-pay | Admitting: Internal Medicine

## 2021-05-28 LAB — HEMOGLOBIN AND HEMATOCRIT, BLOOD
HCT: 31.4 % — ABNORMAL LOW (ref 35.0–45.0)
Hemoglobin: 9.7 g/dL — ABNORMAL LOW (ref 11.7–15.5)

## 2021-06-02 ENCOUNTER — Encounter: Payer: Self-pay | Admitting: *Deleted

## 2021-06-25 ENCOUNTER — Ambulatory Visit: Payer: Medicare Other | Admitting: Internal Medicine

## 2021-07-10 ENCOUNTER — Ambulatory Visit: Payer: Medicare Other | Admitting: Internal Medicine

## 2021-08-13 ENCOUNTER — Ambulatory Visit: Payer: Medicare Other | Admitting: Internal Medicine

## 2021-08-20 ENCOUNTER — Ambulatory Visit: Payer: Medicare Other | Admitting: Internal Medicine

## 2022-04-07 ENCOUNTER — Encounter (HOSPITAL_COMMUNITY): Payer: Self-pay

## 2022-04-07 ENCOUNTER — Emergency Department (HOSPITAL_COMMUNITY)
Admission: EM | Admit: 2022-04-07 | Discharge: 2022-04-07 | Disposition: A | Discharge status: Home / Self Care | Payer: Medicaid Other | Attending: Emergency Medicine | Admitting: Emergency Medicine

## 2022-04-07 ENCOUNTER — Other Ambulatory Visit: Payer: Self-pay

## 2022-04-07 DIAGNOSIS — R21 Rash and other nonspecific skin eruption: Secondary | ICD-10-CM | POA: Diagnosis present

## 2022-04-07 DIAGNOSIS — E119 Type 2 diabetes mellitus without complications: Secondary | ICD-10-CM | POA: Diagnosis not present

## 2022-04-07 DIAGNOSIS — Z79899 Other long term (current) drug therapy: Secondary | ICD-10-CM | POA: Insufficient documentation

## 2022-04-07 DIAGNOSIS — Z7984 Long term (current) use of oral hypoglycemic drugs: Secondary | ICD-10-CM | POA: Insufficient documentation

## 2022-04-07 DIAGNOSIS — Z7951 Long term (current) use of inhaled steroids: Secondary | ICD-10-CM | POA: Insufficient documentation

## 2022-04-07 DIAGNOSIS — I10 Essential (primary) hypertension: Secondary | ICD-10-CM | POA: Diagnosis not present

## 2022-04-07 DIAGNOSIS — J45909 Unspecified asthma, uncomplicated: Secondary | ICD-10-CM | POA: Insufficient documentation

## 2022-04-07 DIAGNOSIS — L089 Local infection of the skin and subcutaneous tissue, unspecified: Secondary | ICD-10-CM | POA: Insufficient documentation

## 2022-04-07 DIAGNOSIS — Z7982 Long term (current) use of aspirin: Secondary | ICD-10-CM | POA: Insufficient documentation

## 2022-04-07 LAB — CBG MONITORING, ED: Glucose-Capillary: 75 mg/dL (ref 70–99)

## 2022-04-07 MED ORDER — DOXYCYCLINE HYCLATE 100 MG PO CAPS: 100.0000 mg | ORAL_CAPSULE | Freq: Two times a day (BID) | ORAL | 0 refills | Status: AC

## 2022-04-07 NOTE — ED Triage Notes (Addendum)
Pt presents to ED with abscess on buttocks, states started draining yesterday

## 2022-04-07 NOTE — Discharge Instructions (Signed)
Take the entire course of the antibiotics you have been prescribed.  Continue to use a warm water soak twice daily, ideally adding Epsom salt to the water will help this site continue to drain.  Suspect there is an infection, possibly introduced from the scratching of the rash.  Try to avoid doing this.  If the rash is particularly itchy, you may rub in a application of hydrocortisone cream (no prescription needed) twice daily.  Follow-up with your family doctor if this antibiotic does not resolve the infection, returning here for any new or worsening symptoms.

## 2022-04-07 NOTE — ED Provider Notes (Signed)
[facility] EMERGENCY DEPARTMENT Provider Note   CSN: [id number] Arrival date & time: 04/07/22  1203     History  Chief Complaint  Patient presents with   Abscess    Andrea Horton is a 71 y.o. female with a history including hypertension, GERD, type 2 diabetes, asthma, states her blood glucose levels have been under good control, presenting for evaluation of a rash on her bilateral buttocks.  She describes an itchy sensation and endorses having scratched copiously at the sites.  Over the past several days she has developed what appears to be an abscess at the left rash site.  She states she has been using warm water soaks and the abscess drained this morning but she continues to have pain at the site.  She has applied Vaseline initially when she thought this was a simple rash with equivocal improvement.  Denies fevers or chills, no nausea or vomiting, denies other complaints.  The history is provided by the patient.       Home Medications Prior to Admission medications   Medication Sig Start Date End Date Taking? Authorizing Provider  doxycycline (VIBRAMYCIN) 100 MG capsule Take 1 capsule (100 mg total) by mouth 2 (two) times daily. 04/07/22  Yes Jaz Mallick, Almyra Free, PA-C  acetaminophen (TYLENOL) 500 MG tablet Take 1,000 mg by mouth every 6 (six) hours as needed (for pain).    [provider]  albuterol (PROVENTIL HFA;VENTOLIN HFA) 108 (90 Base) MCG/ACT inhaler Inhale into the lungs every 6 (six) hours as needed for wheezing or shortness of breath.    [provider]  amLODipine (NORVASC) 5 MG tablet Take 5 mg by mouth daily. 10/30/17   [provider]  ascorbic acid (VITAMIN C) 500 MG tablet Take 1 tablet (500 mg total) by mouth 2 (two) times daily before a meal. 05/13/21   Tat, Shanon Brow, MD  aspirin 81 MG tablet Take 81 mg by mouth daily.    [provider]  cilostazol (PLETAL) 100 MG tablet Take 100 mg by mouth 2 (two) times daily.    [provider]   ferrous sulfate 325 (65 FE) MG tablet Take 1 tablet (325 mg total) by mouth 2 (two) times daily with a meal. 05/13/21   Tat, Shanon Brow, MD  fluticasone (FLONASE) 50 MCG/ACT nasal spray Place 2 sprays into both nostrils daily. 02/26/21   [provider]  Fluticasone-Salmeterol (ADVAIR) 100-50 MCG/DOSE AEPB Inhale 1 puff into the lungs 2 (two) times daily.    [provider]  Fluticasone-Umeclidin-Vilant 100-62.5-25 MCG/ACT AEPB Inhale 1 puff into the lungs daily. 04/17/21   [provider]  ipratropium-albuterol (DUONEB) 0.5-2.5 (3) MG/3ML SOLN Take 3 mLs by nebulization every 6 (six) hours as needed (for shortness of breath/wheezing/congestion).    [provider]  metFORMIN (GLUCOPHAGE) 500 MG tablet Take 500 mg by mouth 2 (two) times daily. 04/24/16   [provider]  simvastatin (ZOCOR) 20 MG tablet Take 20 mg by mouth daily. 04/24/16   [provider]      Allergies    No known allergies    Review of Systems   Review of Systems  Constitutional:  Negative for chills and fever.  Respiratory:  Negative for shortness of breath and wheezing.   Skin:  Positive for rash.  Neurological:  Negative for numbness.  All other systems reviewed and are negative.   Physical Exam Updated Vital Signs BP (!) 143/87 (BP Location: Left Arm)   Pulse 85   Temp 98 F (  36.7 C) (Oral)   Resp 19   Ht 5\' 8"  (1.727 m)   Wt 63.5 kg   SpO2 100%   BMI 21.29 kg/m  Physical Exam Constitutional:      General: She is not in acute distress.    Appearance: She is well-developed.  HENT:     Head: Normocephalic.  Cardiovascular:     Rate and Rhythm: Normal rate.  Pulmonary:     Effort: Pulmonary effort is normal.     Breath sounds: No wheezing.  Musculoskeletal:        General: Normal range of motion.     Cervical back: Neck supple.  Skin:    Findings: Rash present. Rash is pustular.     Comments: One small pustule left buttock in the center of the rash,  excoriations present.  It appears to have drained.  4 mm pustule, no surrounding erythema or induration.    The rash appears dry, sandpaper quality, approximately 3 cm x 1 cm on each buttock at the gluteal base.  No surrounding erythema.     ED Results / Procedures / Treatments   Labs (all labs ordered are listed, but only abnormal results are displayed) Labs Reviewed  CBG MONITORING, ED    EKG None  Radiology No results found.  Procedures Procedures    Medications Ordered in ED Medications - No data to display  ED Course/ Medical Decision Making/ A&P                           Medical Decision Making Small patch of rash on bilateral buttocks, appears to be eczematous in appearance, 1 small draining pustule however left mid rash field.  There is no signs of cellulitis, there are no other papules or pustules present.  The site has drained prior to arrival.  She will be placed on doxycycline, discussed home care, close follow-up for any new or worsening symptoms.  I suspect the pustular infection may be a staph infection from excoriating the site which patient confirms she has been scratching.  She did not have a history of MRSA.           Final Clinical Impression(s) / ED Diagnoses Final diagnoses:  Skin infection    Rx / DC Orders ED Discharge Orders          Ordered    doxycycline (VIBRAMYCIN) 100 MG capsule  2 times daily        04/07/22 1414              04/09/22, PA-C 04/07/22 1420    04/09/22, MD 04/09/22 2123

## 2022-04-07 NOTE — ED Notes (Signed)
Pt given gown and warm blanket

## 2023-08-04 DIAGNOSIS — Z1331 Encounter for screening for depression: Secondary | ICD-10-CM | POA: Diagnosis not present

## 2023-08-04 DIAGNOSIS — J449 Chronic obstructive pulmonary disease, unspecified: Secondary | ICD-10-CM | POA: Diagnosis not present

## 2023-08-04 DIAGNOSIS — L308 Other specified dermatitis: Secondary | ICD-10-CM | POA: Diagnosis not present

## 2023-08-04 DIAGNOSIS — Z1389 Encounter for screening for other disorder: Secondary | ICD-10-CM | POA: Diagnosis not present

## 2023-08-04 DIAGNOSIS — I739 Peripheral vascular disease, unspecified: Secondary | ICD-10-CM | POA: Diagnosis not present

## 2023-08-04 DIAGNOSIS — Z0001 Encounter for general adult medical examination with abnormal findings: Secondary | ICD-10-CM | POA: Diagnosis not present

## 2023-08-04 DIAGNOSIS — I10 Essential (primary) hypertension: Secondary | ICD-10-CM | POA: Diagnosis not present

## 2023-08-04 DIAGNOSIS — E1151 Type 2 diabetes mellitus with diabetic peripheral angiopathy without gangrene: Secondary | ICD-10-CM | POA: Diagnosis not present

## 2023-08-04 DIAGNOSIS — F17210 Nicotine dependence, cigarettes, uncomplicated: Secondary | ICD-10-CM | POA: Diagnosis not present

## 2023-09-15 DIAGNOSIS — E119 Type 2 diabetes mellitus without complications: Secondary | ICD-10-CM | POA: Diagnosis not present

## 2023-09-15 DIAGNOSIS — I10 Essential (primary) hypertension: Secondary | ICD-10-CM | POA: Diagnosis not present

## 2023-10-13 DIAGNOSIS — I10 Essential (primary) hypertension: Secondary | ICD-10-CM | POA: Diagnosis not present

## 2023-10-13 DIAGNOSIS — E119 Type 2 diabetes mellitus without complications: Secondary | ICD-10-CM | POA: Diagnosis not present

## 2023-11-13 DIAGNOSIS — I10 Essential (primary) hypertension: Secondary | ICD-10-CM | POA: Diagnosis not present

## 2023-11-13 DIAGNOSIS — E119 Type 2 diabetes mellitus without complications: Secondary | ICD-10-CM | POA: Diagnosis not present

## 2023-12-13 DIAGNOSIS — I10 Essential (primary) hypertension: Secondary | ICD-10-CM | POA: Diagnosis not present

## 2023-12-13 DIAGNOSIS — E119 Type 2 diabetes mellitus without complications: Secondary | ICD-10-CM | POA: Diagnosis not present

## 2024-01-13 DIAGNOSIS — E119 Type 2 diabetes mellitus without complications: Secondary | ICD-10-CM | POA: Diagnosis not present

## 2024-01-13 DIAGNOSIS — I10 Essential (primary) hypertension: Secondary | ICD-10-CM | POA: Diagnosis not present

## 2024-01-25 DIAGNOSIS — I10 Essential (primary) hypertension: Secondary | ICD-10-CM | POA: Diagnosis not present

## 2024-01-25 DIAGNOSIS — J449 Chronic obstructive pulmonary disease, unspecified: Secondary | ICD-10-CM | POA: Diagnosis not present

## 2024-01-25 DIAGNOSIS — E1151 Type 2 diabetes mellitus with diabetic peripheral angiopathy without gangrene: Secondary | ICD-10-CM | POA: Diagnosis not present

## 2024-01-25 DIAGNOSIS — H409 Unspecified glaucoma: Secondary | ICD-10-CM | POA: Diagnosis not present

## 2024-02-25 DIAGNOSIS — E119 Type 2 diabetes mellitus without complications: Secondary | ICD-10-CM | POA: Diagnosis not present

## 2024-02-25 DIAGNOSIS — I10 Essential (primary) hypertension: Secondary | ICD-10-CM | POA: Diagnosis not present

## 2024-03-27 DIAGNOSIS — I10 Essential (primary) hypertension: Secondary | ICD-10-CM | POA: Diagnosis not present

## 2024-03-27 DIAGNOSIS — E119 Type 2 diabetes mellitus without complications: Secondary | ICD-10-CM | POA: Diagnosis not present

## 2024-04-26 DIAGNOSIS — I10 Essential (primary) hypertension: Secondary | ICD-10-CM | POA: Diagnosis not present

## 2024-04-26 DIAGNOSIS — E119 Type 2 diabetes mellitus without complications: Secondary | ICD-10-CM | POA: Diagnosis not present

## 2024-05-27 DIAGNOSIS — I10 Essential (primary) hypertension: Secondary | ICD-10-CM | POA: Diagnosis not present

## 2024-05-27 DIAGNOSIS — E119 Type 2 diabetes mellitus without complications: Secondary | ICD-10-CM | POA: Diagnosis not present

## 2024-08-15 ENCOUNTER — Other Ambulatory Visit (HOSPITAL_COMMUNITY): Payer: Self-pay | Admitting: Gerontology

## 2024-08-15 DIAGNOSIS — Z87891 Personal history of nicotine dependence: Secondary | ICD-10-CM

## 2024-09-12 ENCOUNTER — Ambulatory Visit (HOSPITAL_COMMUNITY)
